# Patient Record
Sex: Female | Born: 1990 | Race: Black or African American | Hispanic: No | Marital: Single | Smoking: Never smoker
Health system: Southern US, Community
[De-identification: ages and names within clinical notes are randomized; demographics above are authoritative.]

## PROBLEM LIST (undated history)

## (undated) DIAGNOSIS — B009 Herpesviral infection, unspecified: Secondary | ICD-10-CM

## (undated) HISTORY — PX: NO PAST SURGERIES: SHX2092

---

## 2014-08-20 DIAGNOSIS — B009 Herpesviral infection, unspecified: Secondary | ICD-10-CM

## 2014-08-20 HISTORY — DX: Herpesviral infection, unspecified: B00.9

## 2017-05-01 ENCOUNTER — Inpatient Hospital Stay (HOSPITAL_COMMUNITY)
Admission: AD | Admit: 2017-05-01 | Discharge: 2017-05-01 | Disposition: A | Discharge status: Home / Self Care | Payer: Self-pay | Source: Ambulatory Visit | Attending: Obstetrics and Gynecology | Admitting: Obstetrics and Gynecology

## 2017-05-01 ENCOUNTER — Encounter (HOSPITAL_COMMUNITY): Payer: Self-pay

## 2017-05-01 DIAGNOSIS — D509 Iron deficiency anemia, unspecified: Secondary | ICD-10-CM

## 2017-05-01 DIAGNOSIS — Z8261 Family history of arthritis: Secondary | ICD-10-CM | POA: Insufficient documentation

## 2017-05-01 DIAGNOSIS — N939 Abnormal uterine and vaginal bleeding, unspecified: Secondary | ICD-10-CM

## 2017-05-01 DIAGNOSIS — Z3202 Encounter for pregnancy test, result negative: Secondary | ICD-10-CM

## 2017-05-01 DIAGNOSIS — Z8619 Personal history of other infectious and parasitic diseases: Secondary | ICD-10-CM | POA: Insufficient documentation

## 2017-05-01 DIAGNOSIS — Z825 Family history of asthma and other chronic lower respiratory diseases: Secondary | ICD-10-CM | POA: Insufficient documentation

## 2017-05-01 DIAGNOSIS — D696 Thrombocytopenia, unspecified: Secondary | ICD-10-CM

## 2017-05-01 HISTORY — DX: Herpesviral infection, unspecified: B00.9

## 2017-05-01 LAB — URINALYSIS, ROUTINE W REFLEX MICROSCOPIC
BACTERIA UA: NONE SEEN
Bilirubin Urine: NEGATIVE
Glucose, UA: NEGATIVE mg/dL
Ketones, ur: NEGATIVE mg/dL
Leukocytes, UA: NEGATIVE
NITRITE: NEGATIVE
PROTEIN: 100 mg/dL — AB
SPECIFIC GRAVITY, URINE: 1.025 (ref 1.005–1.030)
pH: 5 (ref 5.0–8.0)

## 2017-05-01 LAB — CBC
HCT: 27.8 % — ABNORMAL LOW (ref 36.0–46.0)
Hemoglobin: 8.8 g/dL — ABNORMAL LOW (ref 12.0–15.0)
MCH: 20.7 pg — AB (ref 26.0–34.0)
MCHC: 31.7 g/dL (ref 30.0–36.0)
MCV: 65.3 fL — AB (ref 78.0–100.0)
PLATELETS: 133 10*3/uL — AB (ref 150–400)
RBC: 4.26 MIL/uL (ref 3.87–5.11)
RDW: 17.5 % — AB (ref 11.5–15.5)
WBC: 4.7 10*3/uL (ref 4.0–10.5)

## 2017-05-01 LAB — WET PREP, GENITAL
CLUE CELLS WET PREP: NONE SEEN
Sperm: NONE SEEN
TRICH WET PREP: NONE SEEN
YEAST WET PREP: NONE SEEN

## 2017-05-01 LAB — POCT PREGNANCY, URINE: PREG TEST UR: NEGATIVE

## 2017-05-01 MED ORDER — NORGESTIMATE-ETH ESTRADIOL 0.25-35 MG-MCG PO TABS: 2.0000 | ORAL_TABLET | Freq: Every day | ORAL | 0 refills | Status: DC

## 2017-05-01 MED ORDER — FERROUS SULFATE 325 (65 FE) MG PO TABS: 325.0000 mg | ORAL_TABLET | Freq: Every day | ORAL | 2 refills | Status: DC

## 2017-05-01 MED ORDER — KETOROLAC TROMETHAMINE 60 MG/2ML IM SOLN
60.0000 mg | Freq: Once | INTRAMUSCULAR | Status: AC
  Administered 2017-05-01: 60 mg via INTRAMUSCULAR
  Filled 2017-05-01: qty 2

## 2017-05-01 NOTE — MAU Provider Note (Signed)
History     CSN: [id number]  Arrival date and time: 05/01/17 [id number]   First Provider Initiated Contact with Patient 05/01/17 2203      Chief Complaint  Patient presents with  . Nausea  . Emesis  . Abdominal Pain  . Vaginal Bleeding   Non-pregnant female here with LLQ pain, VB, and N/V. Nausea started 1 week ago and intermittent. Vomiting started today, 3 episodes. Tolerating some po. Pain started 2 days ago. Describes as sharp and intermittent. Has not taken anything for it. No urinary sx. Had BM today, no diarrhea. No sick contacts. No fevers. Started having VB today and LMP was 3 weeks ago. Changing pad and tampon every 2 hrs. Using condoms inconsistently. Recent STD screen negative. New partner 1 month ago. No vaginal discharge or odor prior to bleeding. Hx of HSV, no outbreak in "months". No sexual activity since before August menses.   Past Medical History:  Diagnosis Date  . HSV (herpes simplex virus) infection 2016   acyclovir twice daily    Past Surgical History:  Procedure Laterality Date  . NO PAST SURGERIES      Family History  Problem Relation Age of Onset  . Asthma Mother   . Arthritis Father   . Early death Father     Social History  Substance Use Topics  . Smoking status: Never Smoker  . Smokeless tobacco: Never Used  . Alcohol use 0.6 oz/week    1 Shots of liquor per week     Comment: 1-2 a month    Allergies: No Known Allergies  No prescriptions prior to admission.    Review of Systems  Constitutional: Negative for fever.  Gastrointestinal: Positive for abdominal pain, nausea and vomiting. Negative for constipation and diarrhea.  Genitourinary: Positive for vaginal bleeding. Negative for dysuria and vaginal discharge.   Physical Exam   Blood pressure 127/73, pulse 89, temperature 98.2 F (36.8 C), temperature source Oral, resp. rate 16, last menstrual period 04/11/2017, SpO2 100 %.  Physical Exam  Constitutional: She is oriented to person,  place, and time. She appears well-developed and well-nourished. No distress (appears comfortable).  HENT:  Head: Normocephalic and atraumatic.  Neck: Normal range of motion.  Cardiovascular: Normal rate.   Respiratory: Effort normal. No respiratory distress.  GI: Soft. She exhibits no distension and no mass. There is no tenderness. There is no rebound and no guarding.  Genitourinary:  Genitourinary Comments: External: no lesions or erythema Vagina: rugated, pink, moist, mod bloody discharge Uterus: non enlarged, anteverted, non tender, no CMT Adnexae: no masses, + tenderness left, no tenderness right   Musculoskeletal: Normal range of motion.  Neurological: She is alert and oriented to person, place, and time.  Skin: Skin is warm.  Psychiatric: She has a normal mood and affect.   Results for orders placed or performed during the hospital encounter of 05/01/17 (from the past 24 hour(s))  Urinalysis, Routine w reflex microscopic     Status: Abnormal   Collection Time: 05/01/17  8:45 PM  Result Value Ref Range   Color, Urine YELLOW YELLOW   APPearance HAZY (A) CLEAR   Specific Gravity, Urine 1.025 1.005 - 1.030   pH 5.0 5.0 - 8.0   Glucose, UA NEGATIVE NEGATIVE mg/dL   Hgb urine dipstick LARGE (A) NEGATIVE   Bilirubin Urine NEGATIVE NEGATIVE   Ketones, ur NEGATIVE NEGATIVE mg/dL   Protein, ur 102100 (A) NEGATIVE mg/dL   Nitrite NEGATIVE NEGATIVE   Leukocytes, UA NEGATIVE NEGATIVE  RBC / HPF TOO NUMEROUS TO COUNT 0 - 5 RBC/hpf   WBC, UA TOO NUMEROUS TO COUNT 0 - 5 WBC/hpf   Bacteria, UA NONE SEEN NONE SEEN   Squamous Epithelial / LPF 0-5 (A) NONE SEEN   Mucus PRESENT   Pregnancy, urine POC     Status: None   Collection Time: 05/01/17  9:05 PM  Result Value Ref Range   Preg Test, Ur NEGATIVE NEGATIVE  Wet prep, genital     Status: Abnormal   Collection Time: 05/01/17 10:20 PM  Result Value Ref Range   Yeast Wet Prep HPF POC NONE SEEN NONE SEEN   Trich, Wet Prep NONE SEEN NONE  SEEN   Clue Cells Wet Prep HPF POC NONE SEEN NONE SEEN   WBC, Wet Prep HPF POC MODERATE (A) NONE SEEN   Sperm NONE SEEN   CBC     Status: Abnormal   Collection Time: 05/01/17 10:39 PM  Result Value Ref Range   WBC 4.7 4.0 - 10.5 K/uL   RBC 4.26 3.87 - 5.11 MIL/uL   Hemoglobin 8.8 (L) 12.0 - 15.0 g/dL   HCT 16.1 (L) 09.6 - 04.5 %   MCV 65.3 (L) 78.0 - 100.0 fL   MCH 20.7 (L) 26.0 - 34.0 pg   MCHC 31.7 30.0 - 36.0 g/dL   RDW 40.9 (H) 81.1 - 91.4 %   Platelets 133 (L) 150 - 400 K/uL   MAU Course  Procedures Toradol  MDM Labs ordered and reviewed. No evidence of pregnancy. No evidence of acute abdominal or pelvic process. UA likely contaminated so will send UC. Will use OCPs for AUB. Stable for discharge home.   Assessment and Plan   1. Abnormal uterine bleeding (AUB)   2. Pregnancy examination or test, negative result   3. Iron deficiency anemia, unspecified iron deficiency anemia type   4. Thrombocytopenia (HCC)    Discharge home Follow up with GYN provider of choice to establish care Follow up with PCP provider of choice to establish care and follow thrombocytopenia Rx Sprintec Rx FeSO4 Ibuprofen OTC prn  Allergies as of 05/01/2017   No Known Allergies     Medication List    TAKE these medications   ferrous sulfate 325 (65 FE) MG tablet Take 1 tablet (325 mg total) by mouth daily with breakfast.   norgestimate-ethinyl estradiol 0.25-35 MG-MCG tablet Commonly known as:  ORTHO-CYCLEN,SPRINTEC,PREVIFEM Take 2 tablets by mouth daily. 2 tabs daily until bleeding stops then 1 tab daily            Discharge Care Instructions        Start     Ordered   05/01/17 0000  Discharge patient    Question Answer Comment  Discharge disposition 01-Home or Self Care   Discharge patient date 05/01/2017      05/01/17 2334   05/01/17 0000  norgestimate-ethinyl estradiol (ORTHO-CYCLEN,SPRINTEC,PREVIFEM) 0.25-35 MG-MCG tablet  Daily    Question:  Supervising Provider  Answer:   Tilda Burrow   05/01/17 2334   05/01/17 0000  ferrous sulfate 325 (65 FE) MG tablet  Daily with breakfast    Question:  Supervising Provider  Answer:  Tilda Burrow   05/01/17 2334     Donette Larry, CNM 05/01/2017, 10:19 PM

## 2017-05-01 NOTE — Discharge Instructions (Signed)
Dysfunctional Uterine Bleeding °Dysfunctional uterine bleeding is abnormal bleeding from the uterus. Dysfunctional uterine bleeding includes: °· A period that comes earlier or later than usual. °· A period that is lighter, heavier, or has blood clots. °· Bleeding between periods. °· Skipping one or more periods. °· Bleeding after sexual intercourse. °· Bleeding after menopause. ° °Follow these instructions at home: °Pay attention to any changes in your symptoms. Follow these instructions to help with your condition: °Eating and drinking °· Eat well-balanced meals. Include foods that are high in iron, such as liver, meat, shellfish, green leafy vegetables, and eggs. °· If you become constipated: °? Drink plenty of water. °? Eat fruits and vegetables that are high in water and fiber, such as spinach, carrots, raspberries, apples, and mango. °Medicines °· Take over-the-counter and prescription medicines only as told by your health care provider. °· Do not change medicines without talking with your health care provider. °· Aspirin or medicines that contain aspirin may make the bleeding worse. Do not take those medicines: °? During the week before your period. °? During your period. °· If you were prescribed iron pills, take them as told by your health care provider. Iron pills help to replace iron that your body loses because of this condition. °Activity °· If you need to change your sanitary pad or tampon more than one time every 2 hours: °? Lie in bed with your feet raised (elevated). °? Place a cold pack on your lower abdomen. °? Rest as much as possible until the bleeding stops or slows down. °· Do not try to lose weight until the bleeding has stopped and your blood iron level is back to normal. °Other Instructions °· For two months, write down: °? When your period starts. °? When your period ends. °? When any abnormal bleeding occurs. °? What problems you notice. °· Keep all follow up visits as told by your health  care provider. This is important. °Contact a health care provider if: °· You get light-headed or weak. °· You have nausea and vomiting. °· You cannot eat or drink without vomiting. °· You feel dizzy or have diarrhea while you are taking medicines. °· You are taking birth control pills or hormones, and you want to change them or stop taking them. °Get help right away if: °· You develop a fever or chills. °· You need to change your sanitary pad or tampon more than one time per hour. °· Your bleeding becomes heavier, or your flow contains clots more often. °· You develop pain in your abdomen. °· You lose consciousness. °· You develop a rash. °This information is not intended to replace advice given to you by your health care provider. Make sure you discuss any questions you have with your health care provider. °Document Released: 08/03/2000 Document Revised: 01/12/2016 Document Reviewed: 11/01/2014 °Elsevier Interactive Patient Education © 2018 [employer] ° °

## 2017-05-01 NOTE — Progress Notes (Addendum)
Non-pregnant pt, presents to triage for left side pain and bleeding with n/v  that started    UPT negative.   2212: Provider at bs assessing pt.   Lab ordered. 2232: Medicated for pain  Discharge instructions given with pt understanding. Pt left unit via ambulatory

## 2017-05-01 NOTE — MAU Note (Signed)
Pt thought she had a period a week and a half ago but started bleeding today, she changes her pad an hour.  She has pain on her left side that is sharp and it comes and goes.  Has not taken anything OTC for her pain.  Denies vaginal discharge.  Has had N/V but no diarrhea.  Has had 3 episodes of vomiting in the last 24 hours.

## 2017-05-02 LAB — GC/CHLAMYDIA PROBE AMP (~~LOC~~) NOT AT ARMC
Chlamydia: NEGATIVE
NEISSERIA GONORRHEA: NEGATIVE

## 2017-05-03 LAB — URINE CULTURE

## 2017-05-04 ENCOUNTER — Other Ambulatory Visit: Payer: Self-pay | Admitting: Obstetrics and Gynecology

## 2017-05-04 MED ORDER — AMOXICILLIN-POT CLAVULANATE 875-125 MG PO TABS: 1.0000 | ORAL_TABLET | Freq: Two times a day (BID) | ORAL | 0 refills | Status: AC

## 2017-05-04 NOTE — Progress Notes (Signed)
TC to pt notifying her of Rx to be sent -- requests Rx to be sent to pharmacy near [employer] -- notified will send to [facility] on [address].  Rx called in d/t to no e-scribe capabilities.  Raelyn Mora, CNM 05/04/2017 9:55 AM

## 2017-06-08 ENCOUNTER — Emergency Department (HOSPITAL_COMMUNITY)
Admission: EM | Admit: 2017-06-08 | Discharge: 2017-06-08 | Disposition: A | Discharge status: Home / Self Care | Payer: Self-pay | Attending: Emergency Medicine | Admitting: Emergency Medicine

## 2017-06-08 ENCOUNTER — Encounter (HOSPITAL_COMMUNITY): Payer: Self-pay | Admitting: Emergency Medicine

## 2017-06-08 DIAGNOSIS — L0231 Cutaneous abscess of buttock: Secondary | ICD-10-CM | POA: Insufficient documentation

## 2017-06-08 DIAGNOSIS — L02211 Cutaneous abscess of abdominal wall: Secondary | ICD-10-CM | POA: Insufficient documentation

## 2017-06-08 MED ORDER — SULFAMETHOXAZOLE-TRIMETHOPRIM 800-160 MG PO TABS: 1.0000 | ORAL_TABLET | Freq: Two times a day (BID) | ORAL | 0 refills | Status: AC

## 2017-06-08 MED ORDER — LIDOCAINE HCL (PF) 1 % IJ SOLN
5.0000 mL | Freq: Once | INTRAMUSCULAR | Status: AC
  Administered 2017-06-08: 5 mL
  Filled 2017-06-08: qty 5

## 2017-06-08 MED ORDER — CEPHALEXIN 500 MG PO CAPS: 500.0000 mg | ORAL_CAPSULE | Freq: Four times a day (QID) | ORAL | 0 refills | Status: DC

## 2017-06-08 NOTE — ED Triage Notes (Signed)
Patient reports she had an abscess on her buttocks region drained two weeks ago but now has noticed two more abscesses. One to the left buttocks and one to the mid lower abdomen.

## 2017-06-08 NOTE — ED Provider Notes (Signed)
[facility]-EMERGENCY DEPT Provider Note   CSN: [id number] Arrival date & time: 06/08/17  1258     History   Chief Complaint Chief Complaint  Patient presents with  . Abscess    HPI Jennifer Logan is a 26 y.o. female who presents to the ED with an abscess to the left buttock and the lower abdomen. The areas started about a week ago and have gotten larger. The area on the buttock has started to drain. Patient reports that 2 weeks ago she had an abscess to the right buttock that drained on its own.   The history is provided by the patient. No language interpreter was used.  Abscess  Location:  Torso and pelvis Torso abscess location:  Abd LLQ Pelvic abscess location:  L buttock Abscess quality: fluctuance   Red streaking: no   Duration:  1 week Progression:  Worsening Chronicity:  Recurrent Context: not diabetes   Relieved by:  Nothing Worsened by:  Nothing Ineffective treatments:  Warm compresses Associated symptoms: nausea   Associated symptoms: no fever, no headaches and no vomiting   Risk factors: prior abscess     Past Medical History:  Diagnosis Date  . HSV (herpes simplex virus) infection 2016   acyclovir twice daily    There are no active problems to display for this patient.   Past Surgical History:  Procedure Laterality Date  . NO PAST SURGERIES      OB History    No data available       Home Medications    Prior to Admission medications   Medication Sig Start Date End Date Taking? Authorizing Provider  cephALEXin (KEFLEX) 500 MG capsule Take 1 capsule (500 mg total) by mouth 4 (four) times daily. 06/08/17   Janne Napoleon, NP  ferrous sulfate 325 (65 FE) MG tablet Take 1 tablet (325 mg total) by mouth daily with breakfast. 05/01/17   Donette Larry, CNM  norgestimate-ethinyl estradiol (ORTHO-CYCLEN,SPRINTEC,PREVIFEM) 0.25-35 MG-MCG tablet Take 2 tablets by mouth daily. 2 tabs daily until bleeding stops then 1 tab daily  05/01/17   Donette Larry, CNM  sulfamethoxazole-trimethoprim (BACTRIM DS,SEPTRA DS) 800-160 MG tablet Take 1 tablet by mouth 2 (two) times daily. 06/08/17 06/15/17  Janne Napoleon, NP    Family History Family History  Problem Relation Age of Onset  . Asthma Mother   . Arthritis Father   . Early death Father     Social History Social History  Substance Use Topics  . Smoking status: Never Smoker  . Smokeless tobacco: Never Used  . Alcohol use 0.6 oz/week    1 Shots of liquor per week     Comment: 1-2 a month     Allergies   Patient has no known allergies.   Review of Systems Review of Systems  Constitutional: Positive for chills. Negative for fever.  HENT: Negative.   Respiratory: Negative for cough.   Cardiovascular: Negative for chest pain.  Gastrointestinal: Positive for nausea. Negative for vomiting. Abdominal pain: at area of abscess.  Genitourinary: Positive for vaginal discharge. Negative for dysuria, frequency and urgency.  Musculoskeletal: Negative for neck stiffness.  Skin: Positive for wound.  Neurological: Negative for dizziness, syncope and headaches.  Hematological: Negative for adenopathy.  Psychiatric/Behavioral: Negative for confusion.     Physical Exam Updated Vital Signs BP 126/68 (BP Location: Right Arm)   Pulse (!) 58   Temp 98.2 F (36.8 C) (Oral)   Resp 17   LMP 05/29/2017   SpO2  98%   Physical Exam  Constitutional: She is oriented to person, place, and time. She appears well-developed and well-nourished. No distress.  HENT:  Head: Normocephalic.  Eyes: EOM are normal.  Neck: Neck supple.  Cardiovascular: Normal rate.   Pulmonary/Chest: Effort normal.  Abdominal:  3 cm raised, tender, fluctuant area with erythema and pustular center to the lower abdomen.  Musculoskeletal: Normal range of motion.  Neurological: She is alert and oriented to person, place, and time.  Skin: Skin is warm and dry.  There is a raised tender area to the  left buttock that is draining purulent drainage.   Psychiatric: She has a normal mood and affect. Her behavior is normal.  Nursing note and vitals reviewed.    ED Treatments / Results  Labs (all labs ordered are listed, but only abnormal results are displayed) Labs Reviewed - No data to display Radiology No results found.  Procedures .Marland Kitchen.Incision and Drainage Date/Time: 06/08/2017 4:38 PM Performed by: Janne NapoleonNEESE, Javyn Havlin M Authorized by: Janne NapoleonNEESE, Darnelle [employer] M   Consent:    Consent obtained:  Verbal   Consent given by:  Patient   Risks discussed:  Incomplete drainage and pain   Alternatives discussed:  Alternative treatment Location:    Type:  Abscess   Size:  3 cm   Location:  Trunk   Trunk location:  Abdomen Pre-procedure details:    Skin preparation:  Betadine Anesthesia (see MAR for exact dosages):    Anesthesia method:  Local infiltration   Local anesthetic:  Lidocaine 1% w/o epi Procedure type:    Complexity:  Complex Procedure details:    Needle aspiration: no     Incision types:  Single straight   Incision depth:  Dermal   Scalpel blade:  11   Wound management:  Probed and deloculated and irrigated with saline   Drainage:  Purulent and bloody   Drainage amount:  Moderate   Packing materials:  1/4 in iodoform gauze Post-procedure details:    Patient tolerance of procedure:  Tolerated well, no immediate complications   (including critical care time)  Medications Ordered in ED Medications  lidocaine (PF) (XYLOCAINE) 1 % injection 5 mL (5 mLs Infiltration Given 06/08/17 1506)     Initial Impression / Assessment and Plan / ED Course  I have reviewed the triage vital signs and the nursing notes. 26 y.o. female with abscess to the left buttock that is draining so I&D not indicated. Abscess to the lower abdomen did require I&D. Patient stable for d/c without fever and does not appear toxic. Will treat with antibiotics and patient will f/u in 2 days for wound check and packing  removal.   Final Clinical Impressions(s) / ED Diagnoses   Final diagnoses:  Abscess of abdominal wall  Abscess of left buttock    New Prescriptions Discharge Medication List as of 06/08/2017  4:36 PM    START taking these medications   Details  cephALEXin (KEFLEX) 500 MG capsule Take 1 capsule (500 mg total) by mouth 4 (four) times daily., Starting Sat 06/08/2017, Print    sulfamethoxazole-trimethoprim (BACTRIM DS,SEPTRA DS) 800-160 MG tablet Take 1 tablet by mouth 2 (two) times daily., Starting Sat 06/08/2017, Until Sat 06/15/2017, Print         SteinauerNeese, McGregorHope M, NP 06/08/17 2321    Charlynne PanderYao, David Hsienta, MD 06/09/17 [id number]

## 2017-06-08 NOTE — Discharge Instructions (Signed)
Take the antibiotics as directed. Follow up in 2 days with your doctor, Urgent Care or return here for wound check and packing removal.

## 2017-06-08 NOTE — ED Notes (Signed)
Pt ambulatory and independent at discharge.  Verbalized understanding of discharge instructions 

## 2017-06-10 ENCOUNTER — Emergency Department (HOSPITAL_COMMUNITY)
Admission: EM | Admit: 2017-06-10 | Discharge: 2017-06-10 | Disposition: A | Discharge status: Home / Self Care | Payer: Self-pay | Attending: Emergency Medicine | Admitting: Emergency Medicine

## 2017-06-10 ENCOUNTER — Encounter (HOSPITAL_COMMUNITY): Payer: Self-pay

## 2017-06-10 DIAGNOSIS — Z4801 Encounter for change or removal of surgical wound dressing: Secondary | ICD-10-CM | POA: Insufficient documentation

## 2017-06-10 DIAGNOSIS — Z5189 Encounter for other specified aftercare: Secondary | ICD-10-CM

## 2017-06-10 DIAGNOSIS — Z79899 Other long term (current) drug therapy: Secondary | ICD-10-CM | POA: Insufficient documentation

## 2017-06-10 NOTE — ED Provider Notes (Signed)
[facility]-EMERGENCY DEPT Provider Note   CSN: [id number] Arrival date & time: 06/10/17  7829     History   Chief Complaint Chief Complaint  Patient presents with  . Wound Check    HPI Jennifer Logan is a 26 y.o. female with recent abscess to lower abdomen presents to ED for evaluation of wound after incision and drainage performed 2 days ago in ED. Patient was discharged with antibiotics, which she has been taking. Has also been doing warm compresses. She denies fevers, chills, worsening pain, worsening swelling, redness or drainage. Reports mild tenderness with palpation. HPI  Past Medical History:  Diagnosis Date  . HSV (herpes simplex virus) infection 2016   acyclovir twice daily    There are no active problems to display for this patient.   Past Surgical History:  Procedure Laterality Date  . NO PAST SURGERIES      OB History    No data available       Home Medications    Prior to Admission medications   Medication Sig Start Date End Date Taking? Authorizing Provider  cephALEXin (KEFLEX) 500 MG capsule Take 1 capsule (500 mg total) by mouth 4 (four) times daily. 06/08/17   Janne Napoleon, NP  ferrous sulfate 325 (65 FE) MG tablet Take 1 tablet (325 mg total) by mouth daily with breakfast. 05/01/17   Donette Larry, CNM  norgestimate-ethinyl estradiol (ORTHO-CYCLEN,SPRINTEC,PREVIFEM) 0.25-35 MG-MCG tablet Take 2 tablets by mouth daily. 2 tabs daily until bleeding stops then 1 tab daily 05/01/17   Donette Larry, CNM  sulfamethoxazole-trimethoprim (BACTRIM DS,SEPTRA DS) 800-160 MG tablet Take 1 tablet by mouth 2 (two) times daily. 06/08/17 06/15/17  Janne Napoleon, NP    Family History Family History  Problem Relation Age of Onset  . Asthma Mother   . Arthritis Father   . Early death Father     Social History Social History  Substance Use Topics  . Smoking status: Never Smoker  . Smokeless tobacco: Never Used  . Alcohol use  0.6 oz/week    1 Shots of liquor per week     Comment: 1-2 a month     Allergies   Patient has no known allergies.   Review of Systems Review of Systems  Constitutional: Negative for chills and fever.  Skin: Positive for color change.       Abscess/wound     Physical Exam Updated Vital Signs BP 107/76 (BP Location: Right Arm)   Pulse 86   Temp 98.4 F (36.9 C) (Oral)   Resp 16   Ht 5\' 7"  (1.702 m)   Wt 86.2 kg (190 lb)   LMP 05/29/2017   SpO2 99%   BMI 29.76 kg/m   Physical Exam  Constitutional: She is oriented to person, place, and time. She appears well-developed and well-nourished. No distress.  NAD.  HENT:  Head: Normocephalic and atraumatic.  Right Ear: External ear normal.  Left Ear: External ear normal.  Nose: Nose normal.  Eyes: Conjunctivae and EOM are normal. No scleral icterus.  Neck: Normal range of motion. Neck supple.  Cardiovascular: Normal rate, regular rhythm and normal heart sounds.   No murmur heard. Pulmonary/Chest: Effort normal and breath sounds normal. She has no wheezes.  Musculoskeletal: Normal range of motion. She exhibits no deformity.  Neurological: She is alert and oriented to person, place, and time.  Skin: Skin is warm and dry. Capillary refill takes less than 2 seconds.  1 cm incision below umbilicus with  packing in place, surrounding skin is skin colored w/o erythema, edema, warmth. Appropriately tender.   Psychiatric: She has a normal mood and affect. Her behavior is normal. Judgment and thought content normal.  Nursing note and vitals reviewed.    ED Treatments / Results  Labs (all labs ordered are listed, but only abnormal results are displayed) Labs Reviewed - No data to display  EKG  EKG Interpretation None       Radiology No results found.  Procedures Procedures (including critical care time)  Removal of wound packing performed by myself. Scant amount of purulent drainage expressed from wound with mild  tenderness. Patient tolerated procedure well without complications. Gauze and paper tape used to cover wound.   Medications Ordered in ED Medications - No data to display   Initial Impression / Assessment and Plan / ED Course  I have reviewed the triage vital signs and the nursing notes.  Pertinent labs & imaging results that were available during my care of the patient were reviewed by me and considered in my medical decision making (see chart for details).    Patient here for wound check after abscess to lower abdomen was incised and drained 2 days ago in ED. Packing was removed. No evidence of free infection or recent collection. Wound appears to be healing well. No extensive surrounding cellulitis or induration. She denies fevers, chills or worsening pain. We'll discharge at this time, she is to continue taking antibiotics. NSAID for inflammation and pain. Discussed specific return instructions.  Final Clinical Impressions(s) / ED Diagnoses   Final diagnoses:  Visit for wound check    New Prescriptions New Prescriptions   No medications on file     Liberty HandyGibbons, Alianah Lofton J, PA-C 06/10/17 1150    Samuel JesterMcManus, Kathleen, DO 06/14/17 [id number]

## 2017-06-10 NOTE — ED Notes (Signed)
Bed: WTR8 Expected date:  Expected time:  Means of arrival:  Comments: 

## 2017-06-10 NOTE — Discharge Instructions (Signed)
Continue warm compresses or warm heating pad.   Clean wound as instructed today at least once daily or whenever dirty.   Take ibuprofen or tylenol for pain.   Finish taking your antibiotics.   Return for fevers, chills, worsening pain, swelling or signs of re-infection.

## 2017-06-10 NOTE — ED Triage Notes (Addendum)
Patient states she had an abdominal abscess drained 2 days ago and was told to come back for a recheck.

## 2017-11-15 ENCOUNTER — Emergency Department (HOSPITAL_COMMUNITY)
Admission: EM | Admit: 2017-11-15 | Discharge: 2017-11-15 | Disposition: A | Discharge status: Home / Self Care | Payer: Self-pay | Attending: Emergency Medicine | Admitting: Emergency Medicine

## 2017-11-15 ENCOUNTER — Encounter (HOSPITAL_COMMUNITY): Payer: Self-pay | Admitting: *Deleted

## 2017-11-15 DIAGNOSIS — R102 Pelvic and perineal pain: Secondary | ICD-10-CM | POA: Insufficient documentation

## 2017-11-15 DIAGNOSIS — R112 Nausea with vomiting, unspecified: Secondary | ICD-10-CM | POA: Insufficient documentation

## 2017-11-15 DIAGNOSIS — R197 Diarrhea, unspecified: Secondary | ICD-10-CM | POA: Insufficient documentation

## 2017-11-15 DIAGNOSIS — Z79899 Other long term (current) drug therapy: Secondary | ICD-10-CM | POA: Insufficient documentation

## 2017-11-15 LAB — CBC
HEMATOCRIT: 31.6 % — AB (ref 36.0–46.0)
HEMOGLOBIN: 9.7 g/dL — AB (ref 12.0–15.0)
MCH: 20.8 pg — AB (ref 26.0–34.0)
MCHC: 30.7 g/dL (ref 30.0–36.0)
MCV: 67.8 fL — AB (ref 78.0–100.0)
Platelets: 143 10*3/uL — ABNORMAL LOW (ref 150–400)
RBC: 4.66 MIL/uL (ref 3.87–5.11)
RDW: 17.3 % — ABNORMAL HIGH (ref 11.5–15.5)
WBC: 5.6 10*3/uL (ref 4.0–10.5)

## 2017-11-15 LAB — COMPREHENSIVE METABOLIC PANEL
ALBUMIN: 4.3 g/dL (ref 3.5–5.0)
ALT: 14 U/L (ref 14–54)
ANION GAP: 9 (ref 5–15)
AST: 23 U/L (ref 15–41)
Alkaline Phosphatase: 40 U/L (ref 38–126)
BUN: 10 mg/dL (ref 6–20)
CALCIUM: 8.9 mg/dL (ref 8.9–10.3)
CO2: 25 mmol/L (ref 22–32)
Chloride: 107 mmol/L (ref 101–111)
Creatinine, Ser: 0.73 mg/dL (ref 0.44–1.00)
GFR calc non Af Amer: >60 mL/min (ref >60)
GLUCOSE: 102 mg/dL — AB (ref 65–99)
Potassium: 4 mmol/L (ref 3.5–5.1)
SODIUM: 141 mmol/L (ref 135–145)
Total Bilirubin: 0.5 mg/dL (ref 0.3–1.2)
Total Protein: 7.7 g/dL (ref 6.5–8.1)

## 2017-11-15 LAB — I-STAT BETA HCG BLOOD, ED (MC, WL, AP ONLY)

## 2017-11-15 LAB — LIPASE, BLOOD: LIPASE: 27 U/L (ref 11–51)

## 2017-11-15 MED ORDER — ONDANSETRON HCL 4 MG PO TABS: 4.0000 mg | ORAL_TABLET | Freq: Four times a day (QID) | ORAL | 0 refills | Status: DC

## 2017-11-15 MED ORDER — ONDANSETRON 4 MG PO TBDP
4.0000 mg | ORAL_TABLET | Freq: Once | ORAL | Status: AC | PRN
  Administered 2017-11-15: 4 mg via ORAL
  Filled 2017-11-15: qty 1

## 2017-11-15 MED ORDER — SODIUM CHLORIDE 0.9 % IV BOLUS: 1000.0000 mL | Freq: Once | INTRAVENOUS | Status: DC

## 2017-11-15 NOTE — ED Provider Notes (Signed)
[facility] Provider Note   CSN: [id number] Arrival date & time: 11/15/17  1020     History   Chief Complaint Chief Complaint  Patient presents with  . Emesis    HPI Jennifer Logan is a 27 y.o. female.  The history is provided by the patient.  Emesis   This is a new problem. The current episode started 3 to 5 hours ago. The problem occurs 2 to 4 times per day. The problem has been gradually improving. The emesis has an appearance of stomach contents. There has been no fever. Associated symptoms include abdominal pain, chills, diarrhea and sweats. Pertinent negatives include no cough, no myalgias and no URI. Risk factors: Unknown if she has had any sick contacts but she does work at [employer].  Denies recent travel, antibiotics or bad food.    Past Medical History:  Diagnosis Date  . HSV (herpes simplex virus) infection 2016   acyclovir twice daily    There are no active problems to display for this patient.   Past Surgical History:  Procedure Laterality Date  . NO PAST SURGERIES       OB History   None      Home Medications    Prior to Admission medications   Medication Sig Start Date End Date Taking? Authorizing Provider  valACYclovir (VALTREX) 500 MG tablet Take 500 mg by mouth daily.   Yes [provider]  cephALEXin (KEFLEX) 500 MG capsule Take 1 capsule (500 mg total) by mouth 4 (four) times daily. Patient not taking: Reported on 11/15/2017 06/08/17   Janne NapoleonNeese, Hope M, NP  ferrous sulfate 325 (65 FE) MG tablet Take 1 tablet (325 mg total) by mouth daily with breakfast. Patient not taking: Reported on 11/15/2017 05/01/17   Donette LarryBhambri, Melanie, CNM  norgestimate-ethinyl estradiol (ORTHO-CYCLEN,SPRINTEC,PREVIFEM) 0.25-35 MG-MCG tablet Take 2 tablets by mouth daily. 2 tabs daily until bleeding stops then 1 tab daily Patient not taking: Reported on 11/15/2017 05/01/17   Donette LarryBhambri, Melanie, CNM    Family History Family History    Problem Relation Age of Onset  . Asthma Mother   . Arthritis Father   . Early death Father     Social History Social History   Tobacco Use  . Smoking status: Never Smoker  . Smokeless tobacco: Never Used  Substance Use Topics  . Alcohol use: Yes    Alcohol/week: 0.6 oz    Types: 1 Shots of liquor per week    Comment: 1-2 a month  . Drug use: Yes    Types: Marijuana     Allergies   Patient has no known allergies.   Review of Systems Review of Systems  Constitutional: Positive for chills.  Respiratory: Negative for cough.   Gastrointestinal: Positive for abdominal pain, diarrhea and vomiting.  Musculoskeletal: Negative for myalgias.  All other systems reviewed and are negative.    Physical Exam Updated Vital Signs BP 112/70   Pulse (!) 58   Temp 97.9 F (36.6 C) (Oral)   Resp 17   LMP 10/18/2017   SpO2 100%   Physical Exam  Constitutional: She is oriented to person, place, and time. She appears well-developed and well-nourished. No distress.  HENT:  Head: Normocephalic and atraumatic.  Mouth/Throat: Oropharynx is clear and moist.  Eyes: Pupils are equal, round, and reactive to light. Conjunctivae and EOM are normal.  Neck: Normal range of motion. Neck supple.  Cardiovascular: Normal rate, regular rhythm and intact distal pulses.  No murmur heard.  Pulmonary/Chest: Effort normal and breath sounds normal. No respiratory distress. She has no wheezes. She has no rales.  Abdominal: Soft. She exhibits no distension. There is no tenderness. There is no rebound and no guarding.  Musculoskeletal: Normal range of motion. She exhibits no edema or tenderness.  Neurological: She is alert and oriented to person, place, and time.  Skin: Skin is warm and dry. No rash noted. No erythema.  Psychiatric: She has a normal mood and affect. Her behavior is normal.  Nursing note and vitals reviewed.    ED Treatments / Results  Labs (all labs ordered are listed, but only  abnormal results are displayed) Labs Reviewed  COMPREHENSIVE METABOLIC PANEL - Abnormal; Notable for the following components:      Result Value   Glucose, Bld 102 (*)    All other components within normal limits  CBC - Abnormal; Notable for the following components:   Hemoglobin 9.7 (*)    HCT 31.6 (*)    MCV 67.8 (*)    MCH 20.8 (*)    RDW 17.3 (*)    Platelets 143 (*)    All other components within normal limits  LIPASE, BLOOD  URINALYSIS, ROUTINE W REFLEX MICROSCOPIC  I-STAT BETA HCG BLOOD, ED ([facility], [facility], [facility] ONLY)    EKG None  Radiology No results found.  Procedures Procedures (including critical care time)  Medications Ordered in ED Medications  sodium chloride 0.9 % bolus 1,000 mL (0 mLs Intravenous Hold 11/15/17 1239)  ondansetron (ZOFRAN-ODT) disintegrating tablet 4 mg (4 mg Oral Given 11/15/17 1043)     Initial Impression / Assessment and Plan / ED Course  I have reviewed the triage vital signs and the nursing notes.  Pertinent labs & imaging results that were available during my care of the patient were reviewed by me and considered in my medical decision making (see chart for details).    Pt with symptoms most consistent with a viral process with vomitting/diarrhea.  Denies bad food exposure and recent travel out of the country.  No recent abx.  No hx concerning for GU pathology or kidney stones.  Pt is awake and alert on exam without peritoneal signs. Patient has no abdominal pain on my exam.  She had oral Zofran and has been able to tolerate p.o. since that time.  Labs ordered while she was in triage without acute findings.  Will discharge the patient home.   Final Clinical Impressions(s) / ED Diagnoses   Final diagnoses:  Nausea vomiting and diarrhea    ED Discharge Orders        Ordered    ondansetron (ZOFRAN) 4 MG tablet  Every 6 hours     11/15/17 1327       Gwyneth Sprout, MD 11/15/17 1328

## 2017-11-15 NOTE — ED Triage Notes (Signed)
Pt complains of vomiting, diarrhea and shaking since this morning while getting ready to go to work.

## 2017-11-15 NOTE — ED Notes (Signed)
Pt aware UA needed. 

## 2018-01-29 ENCOUNTER — Encounter (HOSPITAL_COMMUNITY): Payer: Self-pay | Admitting: *Deleted

## 2018-01-29 ENCOUNTER — Inpatient Hospital Stay (HOSPITAL_COMMUNITY)
Admission: AD | Admit: 2018-01-29 | Discharge: 2018-01-29 | Disposition: A | Discharge status: Home / Self Care | Payer: Self-pay | Source: Ambulatory Visit | Attending: Obstetrics and Gynecology | Admitting: Obstetrics and Gynecology

## 2018-01-29 ENCOUNTER — Other Ambulatory Visit: Payer: Self-pay

## 2018-01-29 DIAGNOSIS — B9689 Other specified bacterial agents as the cause of diseases classified elsewhere: Secondary | ICD-10-CM

## 2018-01-29 DIAGNOSIS — N926 Irregular menstruation, unspecified: Secondary | ICD-10-CM

## 2018-01-29 DIAGNOSIS — R109 Unspecified abdominal pain: Secondary | ICD-10-CM | POA: Insufficient documentation

## 2018-01-29 DIAGNOSIS — Z3202 Encounter for pregnancy test, result negative: Secondary | ICD-10-CM | POA: Insufficient documentation

## 2018-01-29 DIAGNOSIS — B373 Candidiasis of vulva and vagina: Secondary | ICD-10-CM | POA: Insufficient documentation

## 2018-01-29 DIAGNOSIS — B3731 Acute candidiasis of vulva and vagina: Secondary | ICD-10-CM

## 2018-01-29 DIAGNOSIS — N760 Acute vaginitis: Secondary | ICD-10-CM

## 2018-01-29 LAB — URINALYSIS, ROUTINE W REFLEX MICROSCOPIC
BACTERIA UA: NONE SEEN
Bilirubin Urine: NEGATIVE
Glucose, UA: NEGATIVE mg/dL
Hgb urine dipstick: NEGATIVE
KETONES UR: NEGATIVE mg/dL
Nitrite: NEGATIVE
Protein, ur: NEGATIVE mg/dL
Specific Gravity, Urine: 1.02 (ref 1.005–1.030)
pH: 5 (ref 5.0–8.0)

## 2018-01-29 LAB — WET PREP, GENITAL
Sperm: NONE SEEN
Trich, Wet Prep: NONE SEEN

## 2018-01-29 LAB — POCT PREGNANCY, URINE: PREG TEST UR: NEGATIVE

## 2018-01-29 MED ORDER — METRONIDAZOLE 500 MG PO TABS: 500.0000 mg | ORAL_TABLET | Freq: Two times a day (BID) | ORAL | 0 refills | Status: DC

## 2018-01-29 MED ORDER — KETOROLAC TROMETHAMINE 60 MG/2ML IM SOLN
60.0000 mg | Freq: Once | INTRAMUSCULAR | Status: AC
  Administered 2018-01-29: 60 mg via INTRAMUSCULAR
  Filled 2018-01-29: qty 2

## 2018-01-29 MED ORDER — FLUCONAZOLE 150 MG PO TABS
150.0000 mg | ORAL_TABLET | Freq: Every day | ORAL | 1 refills | Status: DC
Start: 2018-01-29 — End: 2021-03-09

## 2018-01-29 NOTE — MAU Note (Signed)
LMP was 5/7.  Neg HPT last Friday.   Pain in LLQ for about a wk, sharp cramping.  Today when had bm, noted blood from rectum when she wiped.

## 2018-01-29 NOTE — MAU Provider Note (Signed)
History     CSN: [id number]  Arrival date and time: 01/29/18 1654   First Provider Initiated Contact with Patient 01/29/18 1826      Chief Complaint  Patient presents with  . Abdominal Pain  . Possible Pregnancy  . Rectal Bleeding   HPI  Jennifer Logan is a 27 y.o. female who presents with abdominal pain, vaginal bleeding, and possible pregnancy. LMP was 5/7. Reports regular monthly periods. Has never been on birth control. In monogamous relationship with same partner x 7 months; does not use condoms. Was expecting her period last week but it didn't come; took HPT on Friday that was negative.  Reports vaginal bleeding noted on toilet paper this morning. Is not bleeding into a pad. Reports LLQ abdominal cramping x 1 week. Pain is intermittent. Rates pain 8/10. Took tylenol without relief. Nothing makes pain better or worse. Last BM was this morning, soft stool; no issues with diarrhea or constipation. Not bright red spotting on toilet paper after her BM. No hematochezia.  Endorses vaginal discharge. Thin white discharge. No odor or irritation.  Denies fever/chills, n/v/d, constipation, dysuria.  Does not have a PCP d/t no insurance.   Past Medical History:  Diagnosis Date  . HSV (herpes simplex virus) infection 2016   acyclovir twice daily    Past Surgical History:  Procedure Laterality Date  . NO PAST SURGERIES      Family History  Problem Relation Age of Onset  . Asthma Mother   . Arthritis Father   . Early death Father     Social History   Tobacco Use  . Smoking status: Never Smoker  . Smokeless tobacco: Never Used  Substance Use Topics  . Alcohol use: Yes    Alcohol/week: 0.6 oz    Types: 1 Shots of liquor per week    Comment: 1-2 a month  . Drug use: Yes    Types: Marijuana    Allergies: No Known Allergies  Medications Prior to Admission  Medication Sig Dispense Refill Last Dose  . cephALEXin (KEFLEX) 500 MG capsule Take 1 capsule (500 mg total) by  mouth 4 (four) times daily. (Patient not taking: Reported on 11/15/2017) 20 capsule 0 Not Taking at Unknown time  . ferrous sulfate 325 (65 FE) MG tablet Take 1 tablet (325 mg total) by mouth daily with breakfast. (Patient not taking: Reported on 11/15/2017) 30 tablet 2 Not Taking at Unknown time  . norgestimate-ethinyl estradiol (ORTHO-CYCLEN,SPRINTEC,PREVIFEM) 0.25-35 MG-MCG tablet Take 2 tablets by mouth daily. 2 tabs daily until bleeding stops then 1 tab daily (Patient not taking: Reported on 11/15/2017) 2 Package 0 Not Taking at Unknown time  . ondansetron (ZOFRAN) 4 MG tablet Take 1 tablet (4 mg total) by mouth every 6 (six) hours. 12 tablet 0   . valACYclovir (VALTREX) 500 MG tablet Take 500 mg by mouth daily.   11/14/2017 at Unknown time    Review of Systems  Constitutional: Negative.   Gastrointestinal: Positive for abdominal pain. Negative for abdominal distention, blood in stool, diarrhea, nausea, rectal pain and vomiting.  Genitourinary: Positive for menstrual problem, vaginal bleeding and vaginal discharge. Negative for dyspareunia and dysuria.   Physical Exam   Blood pressure 112/71, pulse 60, temperature 99.7 F (37.6 C), temperature source Oral, resp. rate 16, weight 192 lb 4 oz (87.2 kg), last menstrual period 12/24/2017, SpO2 100 %.  Physical Exam  Nursing note and vitals reviewed. Constitutional: She is oriented to person, place, and time. She appears well-developed and well-nourished.  No distress.  HENT:  Head: Normocephalic and atraumatic.  Eyes: Conjunctivae are normal. Right eye exhibits no discharge. Left eye exhibits no discharge. No scleral icterus.  Neck: Normal range of motion.  Respiratory: Effort normal. No respiratory distress.  GI: Soft. Bowel sounds are normal. She exhibits no distension. There is no tenderness. There is no rebound.  Genitourinary: Rectum normal. Rectal exam shows no external hemorrhoid and no fissure. Cervix exhibits no motion tenderness. Right  adnexum displays no mass and no tenderness. Left adnexum displays no mass and no tenderness. No bleeding in the vagina. Vaginal discharge found.  Neurological: She is alert and oriented to person, place, and time.  Skin: Skin is warm and dry. She is not diaphoretic.  Psychiatric: She has a normal mood and affect. Her behavior is normal. Judgment and thought content normal.    MAU Course  Procedures Results for orders placed or performed during the hospital encounter of 01/29/18 (from the past 24 hour(s))  Pregnancy, urine POC     Status: None   Collection Time: 01/29/18  6:06 PM  Result Value Ref Range   Preg Test, Ur NEGATIVE NEGATIVE  Urinalysis, Routine w reflex microscopic     Status: Abnormal   Collection Time: 01/29/18  6:13 PM  Result Value Ref Range   Color, Urine YELLOW YELLOW   APPearance HAZY (A) CLEAR   Specific Gravity, Urine 1.020 1.005 - 1.030   pH 5.0 5.0 - 8.0   Glucose, UA NEGATIVE NEGATIVE mg/dL   Hgb urine dipstick NEGATIVE NEGATIVE   Bilirubin Urine NEGATIVE NEGATIVE   Ketones, ur NEGATIVE NEGATIVE mg/dL   Protein, ur NEGATIVE NEGATIVE mg/dL   Nitrite NEGATIVE NEGATIVE   Leukocytes, UA TRACE (A) NEGATIVE   RBC / HPF 0-5 0 - 5 RBC/hpf   WBC, UA 0-5 0 - 5 WBC/hpf   Bacteria, UA NONE SEEN NONE SEEN   Squamous Epithelial / LPF 0-5 0 - 5   Mucus PRESENT   Wet prep, genital     Status: Abnormal   Collection Time: 01/29/18  7:11 PM  Result Value Ref Range   Yeast Wet Prep HPF POC PRESENT (A) NONE SEEN   Trich, Wet Prep NONE SEEN NONE SEEN   Clue Cells Wet Prep HPF POC PRESENT (A) NONE SEEN   WBC, Wet Prep HPF POC MODERATE (A) NONE SEEN   Sperm NONE SEEN     MDM UPT negative Toradol 60 mg IM GC/CT & wet prep   ---- declines blood work for STD testing VSS, NAD. Benign abdominal exam Assessment and Plan  A:  1. BV (bacterial vaginosis)   2. Vaginal yeast infection   3. Pregnancy examination or test, negative result   4. Late menses    P: Discharge  home GC/CT pending Rx diflucan & flagyl Establish with PCP F/u with ED for worsening symptoms  [name] 01/29/2018, 6:40 PM

## 2018-01-29 NOTE — Discharge Instructions (Signed)
[place] (Revised August 2014)    Insufficient Money for Medicine:           [address]: call "211"    MAP Program at [place] or HP [phone]            No Primary Care Doctor:  To locate a primary care doctor that accepts your insurance or provides certain services:           [place]: [phone]           Physician Referral Service: [phone] ask for My [place]  If no insurance, you need to see if you qualify for [facility] orange card, call to set      up appointment for eligibility/enrollment at [phone] or [phone] or visit [place] ([address], [place] and [place]) to meet with a [facility] enrollment specialist.  Agencies that provide inexpensive (sliding fee scale) medical care:       [facility] - Family Medicine at [place] - [phone]     [facility] [place] - [facility] - [place] [phone]     [facility] for Children - [place] [phone]  [facility] - [place] @ [place] [phone]  [facility] - [place] @ [place] - [phone]  [facility]: [phone]   Women's Clinic: [phone]   Planned Parenthood: [phone]   [facility] [place]    [place] Providers:           [facility] [phone] (No Family Planning accepted)          [address], [phone], Mon-Fri 9am-5pm          [facility] - [phone]  [place], [address] [place], Mon-Thursday 8am-5pm, Fri 8am-noon  [employer] -  [place], [address], Mon-Fri 7:30am-4:30pm          [place] - [phone]          [address], [facility] - [phone] [address], [address]          Only accepts [place] [employer] patients after they have their name applied to their card  Self Pay (no insurance) in [facility]:           Sickle Cell Patients:   [place], [phone] [facility]:  [address] [place] [phone]       [facility]  [address] [place] [phone]  [place]:  [address] [phone]          [facility]           [place], [phone] [facility] for [place], [phone]           [facility] [place]           [place], [address], [place] [address]           [phone], Mon-Fri 9am-7pm, [place] 9am-1pm          [facility] - Family Medicine @ [place], [place]          [facility] - [facility]           [address] [place] [facility] Adult and [place]  [address] [place] [phone]          [place], [place]  [facility] - [place]   [place], [place] [phone] [facility] and [place]  [address] [phone]  Dr. Vista Lawman           [address], [place], [facility]           [address] [phone]          [facility]             [address]  [phone]          [facility]           [place], [place], 1st & 3rd Saturday every month, 10am-1pm OTHERS:  [facility]  ([facility] Only)  [phone] (Thursday only) Strategies for finding a Primary Care Provider:  1) Find a Doctor and Pay Out of Pocket  Although you won't have to find out who is covered by your insurance plan, it is a good idea to ask around and get recommendations. You will then need to call the office and see if the doctor you have chosen will accept you as a new patient and what types of options they offer for patients who are self-pay. Some doctors offer discounts or will set up payment plans for their patients who do not have insurance, but you will need to ask so you aren't surprised when you get to your appointment.  2) [place] - To see if you qualify for orange card access to healthcare safety net providers.  Call for appointment for eligibility/enrollment at [phone] or [phone]. (Uninsured, 0-200% FPL, qualifying info)  Applicants for [facility] are first required to see if they are eligible to enroll in the [facility] Marketplace before enrolling in [facility] (and get an exemption if they are not).  GCCN Criteria for acceptance is:  ? Proof of ACA Marketing exemption - form or documentation  ? Valid photo ID (driver's license, state identification card, passport, home country ID)  ? Proof of [facility] residency (e.g. drivers license, lease/landlord information, pay stubs with address, utility bill, bank statement, etc.)  ? Proof of income (1040, last year's tax return, W2, 4 current pay stubs, other income proof)  ? Proof of assets (current bank statement +  3 most recent, disability paperwork, life insurance info, tax value on autos, etc.)  3) [place] Department  Not all health departments have doctors that can see patients for sick visits, but many do, so it is worth a call  to see if yours does. If you don't know where your local health department is, you can check in your phone book. The CDC also has a tool to help you locate your state's health department, and many state websites also have listings of all of their local health departments.  4) Find a [place] [facility]  If your illness is not likely to be very severe or complicated, you may want to try a walk in clinic. These are popping up all over the country in pharmacies, drugstores, and shopping centers. They're usually staffed by nurse practitioners or physician assistants that have been trained to treat common illnesses and complaints. They're usually fairly quick and inexpensive. However, if you have serious medical issues or chronic medical problems, these are probably not your best option   STD Testing:           [place], [place] [facility]           [address] [place], phone [phone] or [phone]           Monday - Friday, call for an appointment          [place], [place] [facility]           [address], [place], phone [phone] or [phone]           Monday - Friday, call for an appointment Abuse/Neglect:           [place]: [place]: [phone] (After Hours) Emergency Shelter:  [facility] [phone]  Ages- [phone]  [place] - [phone]  Youth Focus - Act Together - [phone] (ages 32-17)  [place] @ [employer] - [phone]   Mammograms - Free at [facility] [place]:           Room at the [place]: [phone]   (Homeless mother with children)          [place]: [phone] (Mothers only)  Youth Focus: [phone] (Pregnant 3-21 years old)  Adopt a Mom  [phone]  [facility]   [facility] and [place]  [address] [place] [phone]          [facility] [place]           [address], [place], [place]          [facility].           [place], [place]  [place] [facility]           [phone]          [facility] in [place]           [phone]           [phone] (After Hours)  [place] Abuse Resources:           Alcohol and Drug Services: [phone]           [facility]: [phone]          [facility]: [phone]   Narcotics Helpline - [phone]          [facility]: [phone]           Residential & Outpatient Substance Abuse Program - [place]: [phone]  [employer]  [place] and [place] - [phone] Psychological Services:          [place]: [phone]   Therapeutic Alternatives: [phone]          [place]           [address]: [phone]     (24 Hour)  Mobile Crisis:   [place]:  Radio producer on [place] [phone] [facility] on [place] [phone]  Walk In [place]  ([place] - [phone] or [phone]  [place]. [place] [phone]  [place] [address] [place] [phone]   Dental Assistance:  If unable to pay or uninsured, contact: [facility] to become qualified for the adult dental clinic. Patient must be enrolled in [facility] (uninsured, 0-200% FPL, qualifying info).  Enroll in [facility] first, then see Primary Care Physician assigned to you, the PCP makes a dental referral. [place] [facility] will receive referral and contacts patient for appointment.  Patients with Medicaid           [address], [place] (Children up to 80 + Pregnant Women) - [phone]  [place] - Suite [phone]  If unable to pay, or uninsured: contact [place] [phone] in [place] - ([place] only + Pregnant Women), [phone] in [place] only) to become qualified for the adult dental clinic  Must see if eligible to enroll in [place] before enrolling into the [facility] (exemption required) [phone] for an appointment)  [email or web address];   [phone].  If not eligible for ACA, then go by Department of Health and Human Services to see if eligible for orange card.  [address], [place].  Once you get an orange card, you will have a Primary Care home who will then refer you to dental if needed.  Other IT consultantLow-Cost Community Dental Services:   [facility] [phone]- [phone] (ext [phone])   [address]  Dr. Aidric Endicott [place] - [phone]   [address]    [place] [employer] - [phone]   [address] [facility] [address]           [facility]           [address], [place], [place], [id number]           [phone], Ext. [phone]           2nd and 4th Thursday of the month at 6:30am (Simple extractions only - no wisdom teeth or surgery) First come/First serve -First 10 clients served           [facility] [place]([place], [place] and [place] residents only)          [address], [place], [place], 95[address]           [phone]                    [facility]           [phone]          [facility]          [phone]         [facility] - [facility]          [phone]   Transportation Options:  Ambulance - 911 - [phone] per ride Family Member to accompany patient (if stable) Ginette Otto- [place] [employer] - [phone]  PART - [phone]  Taxi - [phone] - [place]  SCAT - [phone] (Application required)  [facility] - [phone]) [phone]          Bacterial Vaginosis Bacterial vaginosis is an infection of the vagina. It happens when too many germs (bacteria) grow in the vagina. This infection puts you at risk for infections from sex (STIs). Treating this infection can lower your risk for some STIs. You should also treat this if you are pregnant. It can cause your baby to be born early. Follow these instructions at home: Medicines  Take over-the-counter and prescription medicines only as told by your doctor.  Take or use your antibiotic medicine as told by your doctor. Do not stop taking or using it even if you start to feel better. General instructions  If you your sexual partner is a woman, tell her that you have this infection. She needs to get treatment if she has symptoms. If you have a female partner, he does not need to be treated.  During treatment: ? Avoid sex. ? Do not douche. ? Avoid alcohol as told. ? Avoid breastfeeding as told.  Drink enough fluid to keep your pee (urine) clear or pale yellow.  Keep your vagina and butt (rectum) clean. ? Wash the area with warm water every day. ? Wipe from front to back after you use the toilet.  Keep all follow-up visits as told by your doctor. This is important. Preventing this condition  Do not douche.  Use only warm water  to wash around your vagina.  Use protection when you have sex. This includes: ? Latex condoms. ? Dental dams.  Limit how many people you have sex with. It is best to only have sex with the same person (be monogamous).  Get tested for STIs. Have your partner get tested.  Wear underwear that  is cotton or lined with cotton.  Avoid tight pants and pantyhose. This is most important in summer.  Do not use any products that have nicotine or tobacco in them. These include cigarettes and e-cigarettes. If you need help quitting, ask your doctor.  Do not use illegal drugs.  Limit how much alcohol you drink. Contact a doctor if:  Your symptoms do not get better, even after you are treated.  You have more discharge or pain when you pee (urinate).  You have a fever.  You have pain in your belly (abdomen).  You have pain with sex.  Your bleed from your vagina between periods. Summary  This infection happens when too many germs (bacteria) grow in the vagina.  Treating this condition can lower your risk for some infections from sex (STIs).  You should also treat this if you are pregnant. It can cause early (premature) birth.  Do not stop taking or using your antibiotic medicine even if you start to feel better. This information is not intended to replace advice given to you by your health care provider. Make sure you discuss any questions you have with your health care provider. Document Released: 05/15/2008 Document Revised: 04/21/2016 Document Reviewed: 04/21/2016 Elsevier Interactive Patient Education  2017 Elsevier Inc.  Abdominal Pain, Adult Many things can cause belly (abdominal) pain. Most times, belly pain is not dangerous. Many cases of belly pain can be watched and treated at home. Sometimes belly pain is serious, though. Your doctor will try to find the cause of your belly pain. Follow these instructions at home:  Take over-the-counter and prescription medicines only as  told by your doctor. Do not take medicines that help you poop (laxatives) unless told to by your doctor.  Drink enough fluid to keep your pee (urine) clear or pale yellow.  Watch your belly pain for any changes.  Keep all follow-up visits as told by your doctor. This is important. Contact a doctor if:  Your belly pain changes or gets worse.  You are not hungry, or you lose weight without trying.  You are having trouble pooping (constipated) or have watery poop (diarrhea) for more than 2-3 days.  You have pain when you pee or poop.  Your belly pain wakes you up at night.  Your pain gets worse with meals, after eating, or with certain foods.  You are throwing up and cannot keep anything down.  You have a fever. Get help right away if:  Your pain does not go away as soon as your doctor says it should.  You cannot stop throwing up.  Your pain is only in areas of your belly, such as the right side or the left lower part of the belly.  You have bloody or black poop, or poop that looks like tar.  You have very bad pain, cramping, or bloating in your belly.  You have signs of not having enough fluid or water in your body (dehydration), such as: ? Dark pee, very little pee, or no pee. ? Cracked lips. ? Dry mouth. ? Sunken eyes. ? Sleepiness. ? Weakness. This information is not intended to replace advice given to you by your health care provider. Make sure you discuss any questions you have with your health care provider. Document Released: 01/23/2008 Document Revised: 02/24/2016 Document Reviewed: 01/18/2016 Elsevier Interactive Patient Education  2018 [employer].

## 2018-01-30 LAB — GC/CHLAMYDIA PROBE AMP (~~LOC~~) NOT AT ARMC
CHLAMYDIA, DNA PROBE: NEGATIVE
NEISSERIA GONORRHEA: NEGATIVE

## 2019-09-04 ENCOUNTER — Other Ambulatory Visit: Payer: Self-pay | Admitting: Obstetrics & Gynecology

## 2019-09-04 DIAGNOSIS — N9489 Other specified conditions associated with female genital organs and menstrual cycle: Secondary | ICD-10-CM

## 2019-09-24 ENCOUNTER — Other Ambulatory Visit: Payer: Self-pay

## 2019-09-24 ENCOUNTER — Ambulatory Visit
Admission: RE | Admit: 2019-09-24 | Discharge: 2019-09-24 | Disposition: A | Discharge status: Home / Self Care | Payer: Medicaid Other | Source: Ambulatory Visit | Attending: Obstetrics & Gynecology | Admitting: Obstetrics & Gynecology

## 2019-09-24 DIAGNOSIS — N9489 Other specified conditions associated with female genital organs and menstrual cycle: Secondary | ICD-10-CM

## 2019-09-24 MED ORDER — GADOBENATE DIMEGLUMINE 529 MG/ML IV SOLN
18.0000 mL | Freq: Once | INTRAVENOUS | Status: AC | PRN
  Administered 2019-09-24: 18 mL via INTRAVENOUS

## 2021-02-12 ENCOUNTER — Other Ambulatory Visit: Payer: Self-pay

## 2021-02-12 ENCOUNTER — Emergency Department (HOSPITAL_COMMUNITY): Payer: Self-pay

## 2021-02-12 ENCOUNTER — Encounter (HOSPITAL_COMMUNITY): Payer: Self-pay | Admitting: *Deleted

## 2021-02-12 ENCOUNTER — Inpatient Hospital Stay (HOSPITAL_COMMUNITY)
Admission: EM | Admit: 2021-02-12 | Discharge: 2021-02-20 | DRG: 958 | Disposition: A | Discharge status: Rehabilitation Facility | Payer: Self-pay | Attending: Physician Assistant | Admitting: Physician Assistant

## 2021-02-12 DIAGNOSIS — S82871A Displaced pilon fracture of right tibia, initial encounter for closed fracture: Secondary | ICD-10-CM | POA: Diagnosis present

## 2021-02-12 DIAGNOSIS — S62102A Fracture of unspecified carpal bone, left wrist, initial encounter for closed fracture: Secondary | ICD-10-CM

## 2021-02-12 DIAGNOSIS — S2249XA Multiple fractures of ribs, unspecified side, initial encounter for closed fracture: Secondary | ICD-10-CM | POA: Diagnosis present

## 2021-02-12 DIAGNOSIS — S52572A Other intraarticular fracture of lower end of left radius, initial encounter for closed fracture: Secondary | ICD-10-CM | POA: Diagnosis present

## 2021-02-12 DIAGNOSIS — S32424A Nondisplaced fracture of posterior wall of right acetabulum, initial encounter for closed fracture: Secondary | ICD-10-CM | POA: Diagnosis present

## 2021-02-12 DIAGNOSIS — S32409A Unspecified fracture of unspecified acetabulum, initial encounter for closed fracture: Secondary | ICD-10-CM

## 2021-02-12 DIAGNOSIS — R509 Fever, unspecified: Secondary | ICD-10-CM

## 2021-02-12 DIAGNOSIS — S52502A Unspecified fracture of the lower end of left radius, initial encounter for closed fracture: Secondary | ICD-10-CM | POA: Insufficient documentation

## 2021-02-12 DIAGNOSIS — Z20822 Contact with and (suspected) exposure to covid-19: Secondary | ICD-10-CM | POA: Diagnosis present

## 2021-02-12 DIAGNOSIS — Z8261 Family history of arthritis: Secondary | ICD-10-CM

## 2021-02-12 DIAGNOSIS — D62 Acute posthemorrhagic anemia: Secondary | ICD-10-CM | POA: Diagnosis not present

## 2021-02-12 DIAGNOSIS — Z79899 Other long term (current) drug therapy: Secondary | ICD-10-CM

## 2021-02-12 DIAGNOSIS — Z825 Family history of asthma and other chronic lower respiratory diseases: Secondary | ICD-10-CM

## 2021-02-12 DIAGNOSIS — J939 Pneumothorax, unspecified: Secondary | ICD-10-CM

## 2021-02-12 DIAGNOSIS — K5900 Constipation, unspecified: Secondary | ICD-10-CM | POA: Diagnosis not present

## 2021-02-12 DIAGNOSIS — R059 Cough, unspecified: Secondary | ICD-10-CM | POA: Diagnosis not present

## 2021-02-12 DIAGNOSIS — Z23 Encounter for immunization: Secondary | ICD-10-CM | POA: Diagnosis not present

## 2021-02-12 DIAGNOSIS — R52 Pain, unspecified: Secondary | ICD-10-CM | POA: Diagnosis not present

## 2021-02-12 DIAGNOSIS — S270XXA Traumatic pneumothorax, initial encounter: Secondary | ICD-10-CM | POA: Diagnosis present

## 2021-02-12 DIAGNOSIS — S32462A Displaced associated transverse-posterior fracture of left acetabulum, initial encounter for closed fracture: Secondary | ICD-10-CM | POA: Diagnosis not present

## 2021-02-12 DIAGNOSIS — S2242XA Multiple fractures of ribs, left side, initial encounter for closed fracture: Secondary | ICD-10-CM

## 2021-02-12 DIAGNOSIS — S27321A Contusion of lung, unilateral, initial encounter: Secondary | ICD-10-CM | POA: Diagnosis present

## 2021-02-12 DIAGNOSIS — T148XXA Other injury of unspecified body region, initial encounter: Secondary | ICD-10-CM

## 2021-02-12 DIAGNOSIS — Y92410 Unspecified street and highway as the place of occurrence of the external cause: Secondary | ICD-10-CM | POA: Diagnosis not present

## 2021-02-12 DIAGNOSIS — M25579 Pain in unspecified ankle and joints of unspecified foot: Secondary | ICD-10-CM

## 2021-02-12 DIAGNOSIS — M25571 Pain in right ankle and joints of right foot: Secondary | ICD-10-CM

## 2021-02-12 DIAGNOSIS — S32402A Unspecified fracture of left acetabulum, initial encounter for closed fracture: Secondary | ICD-10-CM | POA: Insufficient documentation

## 2021-02-12 DIAGNOSIS — Z419 Encounter for procedure for purposes other than remedying health state, unspecified: Secondary | ICD-10-CM

## 2021-02-12 LAB — ETHANOL: Alcohol, Ethyl (B): <10 mg/dL (ref <10)

## 2021-02-12 LAB — CBC
HCT: 30.2 % — ABNORMAL LOW (ref 36.0–46.0)
HCT: 29.1 % — ABNORMAL LOW (ref 36.0–46.0)
Hemoglobin: 9.1 g/dL — ABNORMAL LOW (ref 12.0–15.0)
Hemoglobin: 8.7 g/dL — ABNORMAL LOW (ref 12.0–15.0)
MCH: 21.4 pg — ABNORMAL LOW (ref 26.0–34.0)
MCH: 21.1 pg — ABNORMAL LOW (ref 26.0–34.0)
MCHC: 30.1 g/dL (ref 30.0–36.0)
MCHC: 29.9 g/dL — ABNORMAL LOW (ref 30.0–36.0)
MCV: 70.9 fL — ABNORMAL LOW (ref 80.0–100.0)
MCV: 70.6 fL — ABNORMAL LOW (ref 80.0–100.0)
Platelets: 133 10*3/uL — ABNORMAL LOW (ref 150–400)
Platelets: 133 10*3/uL — ABNORMAL LOW (ref 150–400)
RBC: 4.26 MIL/uL (ref 3.87–5.11)
RBC: 4.12 MIL/uL (ref 3.87–5.11)
RDW: 17.6 % — ABNORMAL HIGH (ref 11.5–15.5)
RDW: 17.6 % — ABNORMAL HIGH (ref 11.5–15.5)
WBC: 12.5 10*3/uL — ABNORMAL HIGH (ref 4.0–10.5)
WBC: 11.7 10*3/uL — ABNORMAL HIGH (ref 4.0–10.5)
nRBC: 0 % (ref 0.0–0.2)
nRBC: 0 % (ref 0.0–0.2)

## 2021-02-12 LAB — COMPREHENSIVE METABOLIC PANEL
ALT: 37 U/L (ref 0–44)
AST: 77 U/L — ABNORMAL HIGH (ref 15–41)
Albumin: 3.5 g/dL (ref 3.5–5.0)
Alkaline Phosphatase: 48 U/L (ref 38–126)
Anion gap: 8 (ref 5–15)
BUN: 13 mg/dL (ref 6–20)
CO2: 23 mmol/L (ref 22–32)
Calcium: 8.8 mg/dL — ABNORMAL LOW (ref 8.9–10.3)
Chloride: 108 mmol/L (ref 98–111)
Creatinine, Ser: 1.04 mg/dL — ABNORMAL HIGH (ref 0.44–1.00)
GFR, Estimated: >60 mL/min (ref >60)
Glucose, Bld: 148 mg/dL — ABNORMAL HIGH (ref 70–99)
Potassium: 3.6 mmol/L (ref 3.5–5.1)
Sodium: 139 mmol/L (ref 135–145)
Total Bilirubin: 1 mg/dL (ref 0.3–1.2)
Total Protein: 5.9 g/dL — ABNORMAL LOW (ref 6.5–8.1)

## 2021-02-12 LAB — I-STAT CHEM 8, ED
BUN: 13 mg/dL (ref 6–20)
Calcium, Ion: 1.15 mmol/L (ref 1.15–1.40)
Chloride: 107 mmol/L (ref 98–111)
Creatinine, Ser: 1 mg/dL (ref 0.44–1.00)
Glucose, Bld: 150 mg/dL — ABNORMAL HIGH (ref 70–99)
HCT: 29 % — ABNORMAL LOW (ref 36.0–46.0)
Hemoglobin: 9.9 g/dL — ABNORMAL LOW (ref 12.0–15.0)
Potassium: 3.2 mmol/L — ABNORMAL LOW (ref 3.5–5.1)
Sodium: 142 mmol/L (ref 135–145)
TCO2: 23 mmol/L (ref 22–32)

## 2021-02-12 LAB — CREATININE, SERUM
Creatinine, Ser: 0.8 mg/dL (ref 0.44–1.00)
GFR, Estimated: >60 mL/min (ref >60)

## 2021-02-12 LAB — RESP PANEL BY RT-PCR (FLU A&B, COVID) ARPGX2
Influenza A by PCR: NEGATIVE
Influenza B by PCR: NEGATIVE
SARS Coronavirus 2 by RT PCR: NEGATIVE

## 2021-02-12 LAB — PROTIME-INR
INR: 1 (ref 0.8–1.2)
Prothrombin Time: 13.6 seconds (ref 11.4–15.2)

## 2021-02-12 LAB — SAMPLE TO BLOOD BANK

## 2021-02-12 LAB — HCG, QUANTITATIVE, PREGNANCY: hCG, Beta Chain, Quant, S: 1 m[IU]/mL (ref <5)

## 2021-02-12 LAB — HIV ANTIBODY (ROUTINE TESTING W REFLEX): HIV Screen 4th Generation wRfx: NONREACTIVE

## 2021-02-12 LAB — LACTIC ACID, PLASMA: Lactic Acid, Venous: 2.4 mmol/L (ref 0.5–1.9)

## 2021-02-12 MED ORDER — OXYCODONE HCL 5 MG PO TABS: 5.0000 mg | ORAL_TABLET | ORAL | Status: DC | PRN

## 2021-02-12 MED ORDER — FENTANYL CITRATE (PF) 100 MCG/2ML IJ SOLN
50.0000 ug | Freq: Once | INTRAMUSCULAR | Status: AC
  Administered 2021-02-12: 50 ug via INTRAVENOUS
  Filled 2021-02-12: qty 2

## 2021-02-12 MED ORDER — OXYCODONE HCL 5 MG PO TABS
10.0000 mg | ORAL_TABLET | ORAL | Status: DC | PRN
  Filled 2021-02-12: qty 2

## 2021-02-12 MED ORDER — ONDANSETRON HCL 4 MG/2ML IJ SOLN
4.0000 mg | Freq: Four times a day (QID) | INTRAMUSCULAR | Status: DC | PRN
  Administered 2021-02-12: 4 mg via INTRAVENOUS
  Filled 2021-02-12: qty 2

## 2021-02-12 MED ORDER — HYDROMORPHONE HCL 1 MG/ML IJ SOLN
0.5000 mg | INTRAMUSCULAR | Status: DC | PRN
  Administered 2021-02-12 – 2021-02-13 (×4): 0.5 mg via INTRAVENOUS
  Filled 2021-02-12 (×2): qty 0.5
  Filled 2021-02-12: qty 1
  Filled 2021-02-12: qty 0.5

## 2021-02-12 MED ORDER — LACTATED RINGERS IV SOLN: INTRAVENOUS | Status: DC

## 2021-02-12 MED ORDER — LACTATED RINGERS IV BOLUS
1000.0000 mL | Freq: Once | INTRAVENOUS | Status: AC
  Administered 2021-02-12: 1000 mL via INTRAVENOUS

## 2021-02-12 MED ORDER — ENOXAPARIN SODIUM 30 MG/0.3ML IJ SOSY
30.0000 mg | PREFILLED_SYRINGE | Freq: Two times a day (BID) | INTRAMUSCULAR | Status: DC
  Administered 2021-02-13 – 2021-02-14 (×3): 30 mg via SUBCUTANEOUS
  Filled 2021-02-12 (×3): qty 0.3

## 2021-02-12 MED ORDER — IOHEXOL 350 MG/ML SOLN
100.0000 mL | Freq: Once | INTRAVENOUS | Status: AC | PRN
  Administered 2021-02-12: 100 mL via INTRAVENOUS

## 2021-02-12 MED ORDER — ACETAMINOPHEN 325 MG PO TABS
650.0000 mg | ORAL_TABLET | Freq: Four times a day (QID) | ORAL | Status: DC
  Administered 2021-02-12 (×2): 650 mg via ORAL
  Filled 2021-02-12 (×3): qty 2

## 2021-02-12 MED ORDER — HYDROMORPHONE HCL 1 MG/ML IJ SOLN
1.0000 mg | Freq: Once | INTRAMUSCULAR | Status: AC
  Administered 2021-02-12: 1 mg via INTRAVENOUS
  Filled 2021-02-12: qty 1

## 2021-02-12 MED ORDER — ONDANSETRON 4 MG PO TBDP: 4.0000 mg | ORAL_TABLET | Freq: Four times a day (QID) | ORAL | Status: DC | PRN

## 2021-02-12 MED ORDER — TETANUS-DIPHTH-ACELL PERTUSSIS 5-2.5-18.5 LF-MCG/0.5 IM SUSY
0.5000 mL | PREFILLED_SYRINGE | Freq: Once | INTRAMUSCULAR | Status: AC
  Administered 2021-02-12: 0.5 mL via INTRAMUSCULAR
  Filled 2021-02-12: qty 0.5
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
## 2021-02-12 NOTE — ED Notes (Signed)
Patient transported to CT 

## 2021-02-12 NOTE — Progress Notes (Signed)
Orthopedic Tech Progress Note Patient Details:  Jennifer Logan [birth year] [id number]  Ortho Devices Type of Ortho Device: Sugartong splint Ortho Device/Splint Location: LUE Ortho Device/Splint Interventions: Ordered, Application, Adjustment   Post Interventions Patient Tolerated: Fair Instructions Provided: Care of device, Poper ambulation with device  Carolanne Mercier 02/12/2021, 12:21 PM

## 2021-02-13 ENCOUNTER — Inpatient Hospital Stay (HOSPITAL_COMMUNITY): Payer: Self-pay

## 2021-02-13 LAB — CBC
HCT: 29.2 % — ABNORMAL LOW (ref 36.0–46.0)
Hemoglobin: 9 g/dL — ABNORMAL LOW (ref 12.0–15.0)
MCH: 21.3 pg — ABNORMAL LOW (ref 26.0–34.0)
MCHC: 30.8 g/dL (ref 30.0–36.0)
MCV: 69 fL — ABNORMAL LOW (ref 80.0–100.0)
Platelets: 127 10*3/uL — ABNORMAL LOW (ref 150–400)
RBC: 4.23 MIL/uL (ref 3.87–5.11)
RDW: 17.6 % — ABNORMAL HIGH (ref 11.5–15.5)
WBC: 7.3 10*3/uL (ref 4.0–10.5)
nRBC: 0 % (ref 0.0–0.2)

## 2021-02-13 LAB — SURGICAL PCR SCREEN
MRSA, PCR: NEGATIVE
Staphylococcus aureus: NEGATIVE

## 2021-02-13 LAB — BASIC METABOLIC PANEL
Anion gap: 8 (ref 5–15)
BUN: 7 mg/dL (ref 6–20)
CO2: 23 mmol/L (ref 22–32)
Calcium: 8.5 mg/dL — ABNORMAL LOW (ref 8.9–10.3)
Chloride: 106 mmol/L (ref 98–111)
Creatinine, Ser: 0.78 mg/dL (ref 0.44–1.00)
GFR, Estimated: >60 mL/min (ref >60)
Glucose, Bld: 103 mg/dL — ABNORMAL HIGH (ref 70–99)
Potassium: 3.8 mmol/L (ref 3.5–5.1)
Sodium: 137 mmol/L (ref 135–145)

## 2021-02-13 MED ORDER — MUPIROCIN 2 % EX OINT
1.0000 "application " | TOPICAL_OINTMENT | Freq: Two times a day (BID) | CUTANEOUS | Status: AC
  Administered 2021-02-13 – 2021-02-17 (×9): 1 via NASAL
  Filled 2021-02-13 (×4): qty 22

## 2021-02-13 MED ORDER — HYDROMORPHONE HCL 1 MG/ML IJ SOLN
1.0000 mg | INTRAMUSCULAR | Status: DC | PRN
  Administered 2021-02-13 – 2021-02-16 (×10): 1 mg via INTRAVENOUS
  Filled 2021-02-13 (×10): qty 1

## 2021-02-13 MED ORDER — LIDOCAINE 5 % EX PTCH
1.0000 | MEDICATED_PATCH | CUTANEOUS | Status: DC
  Administered 2021-02-13 – 2021-02-19 (×6): 1 via TRANSDERMAL
  Filled 2021-02-13 (×6): qty 1

## 2021-02-13 MED ORDER — METHOCARBAMOL 500 MG PO TABS
500.0000 mg | ORAL_TABLET | Freq: Three times a day (TID) | ORAL | Status: DC
  Administered 2021-02-13 – 2021-02-14 (×4): 500 mg via ORAL
  Filled 2021-02-13 (×4): qty 1

## 2021-02-13 MED ORDER — ACETAMINOPHEN 500 MG PO TABS
1000.0000 mg | ORAL_TABLET | Freq: Four times a day (QID) | ORAL | Status: DC
  Administered 2021-02-13 – 2021-02-20 (×16): 1000 mg via ORAL
  Filled 2021-02-13 (×23): qty 2

## 2021-02-13 NOTE — Progress Notes (Signed)
   02/13/21 0350  Provider Notification  Provider Name/Title Janee Morn, MD  Date Provider Notified 02/13/21  Time Provider Notified [phone]  Notification Type Page  Notification Reason Other (Comment) (patient c/o chest pain, 9/10)  Provider response No new orders  Date of Provider Response 02/13/21  Time of Provider Response 0400  Rapid Response Notification  Date Rapid Response Notified 02/13/21  Time Rapid Response Notified 0355   VSS. 123/78, pulse 84, temp 98.3 oral, RR 20, 98% on RA. EKG WNL. Rapid Response at bedside to assess. Recommended to give Dilaugid 0.5 mg IV PRN for pain. Labs drawn, pending results.

## 2021-02-13 NOTE — Significant Event (Signed)
Rapid Response Event Note   Reason for Call :  Chest pain  Initial Focused Assessment:  Pt lying in bed with eyes closed, in no distress. Pt awakens easily, is alert and oriented, and c/o 9/10 L sided chest pain/pressure. Lungs clear. Skin warm and dry. When not stimulated, pt falls back to sleep.  HR-84, BP-123/78, RR-18, SpO2-98% on RA   Interventions:  EKG-NSR Dilaudid for pain (prn order) Plan of Care:  Pt sleeping after interventions. Continue to monitor. Call RRT if further assistance needed.   Event Summary:   MD Notified: Dr. Janee Morn notified by bedside RT Call [phone] Arrival [phone] End [id number]  Terrilyn Saver, RN
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
## 2021-02-13 NOTE — Progress Notes (Signed)
   02/13/21 0350  Provider Notification  Provider Name/Title Janee Morn, MD  Date Provider Notified 02/13/21  Time Provider Notified [phone]  Notification Type Page  Notification Reason Other (Comment) (patient c/o chest pain, 9/10)  Provider response No new orders  Date of Provider Response 02/13/21  Time of Provider Response 0400  Rapid Response Notification  Date Rapid Response Notified 02/13/21  Time Rapid Response Notified 0355   Complained of left chest pain, 9/10. EKG WNL. VSS. Labs drawn, pending results. Dilaudid 0.5 mg IV PRN given per rapid response recommendation.

## 2021-02-13 NOTE — Progress Notes (Signed)
   02/13/21 0430  Vitals  Temp 98.2 F (36.8 C)  Temp Source Oral  BP 125/69  MAP (mmHg) 84  BP Location Right Arm  BP Method Automatic  Patient Position (if appropriate) Lying  Pulse Rate 62  Pulse Rate Source Dinamap  Resp 18  Level of Consciousness  Level of Consciousness Alert  MEWS COLOR  MEWS Score Color Green  Oxygen Therapy  SpO2 97 %  MEWS Score  MEWS Temp 0  MEWS Systolic 0  MEWS Pulse 0  MEWS RR 0  MEWS LOC 0  MEWS Score 0   Denies CP or SOB. Pt states she feels better. Will continue to monitor.

## 2021-02-13 NOTE — Progress Notes (Signed)
Orthopedic Tech Progress Note Patient Details:  Jennifer Logan [birth year] [id number]  RN held while i applied SHORT LEG SPLINT   . Ortho Devices Type of Ortho Device: Cotton web roll, Short leg splint Ortho Device/Splint Location: RLE Ortho Device/Splint Interventions: Ordered, Application   Post Interventions Patient Tolerated: Well Instructions Provided: Care of device  Donald Pore 02/13/2021, 10:52 AM
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
## 2021-02-14 LAB — BASIC METABOLIC PANEL
Anion gap: 6 (ref 5–15)
BUN: 7 mg/dL (ref 6–20)
CO2: 25 mmol/L (ref 22–32)
Calcium: 8.2 mg/dL — ABNORMAL LOW (ref 8.9–10.3)
Chloride: 105 mmol/L (ref 98–111)
Creatinine, Ser: 1 mg/dL (ref 0.44–1.00)
GFR, Estimated: >60 mL/min (ref >60)
Glucose, Bld: 107 mg/dL — ABNORMAL HIGH (ref 70–99)
Potassium: 3.5 mmol/L (ref 3.5–5.1)
Sodium: 136 mmol/L (ref 135–145)

## 2021-02-14 LAB — CBC
HCT: 27.5 % — ABNORMAL LOW (ref 36.0–46.0)
Hemoglobin: 8.3 g/dL — ABNORMAL LOW (ref 12.0–15.0)
MCH: 21 pg — ABNORMAL LOW (ref 26.0–34.0)
MCHC: 30.2 g/dL (ref 30.0–36.0)
MCV: 69.6 fL — ABNORMAL LOW (ref 80.0–100.0)
Platelets: 125 10*3/uL — ABNORMAL LOW (ref 150–400)
RBC: 3.95 MIL/uL (ref 3.87–5.11)
RDW: 17.3 % — ABNORMAL HIGH (ref 11.5–15.5)
WBC: 6.5 10*3/uL (ref 4.0–10.5)
nRBC: 0 % (ref 0.0–0.2)

## 2021-02-14 MED ORDER — TRAMADOL HCL 50 MG PO TABS
50.0000 mg | ORAL_TABLET | Freq: Four times a day (QID) | ORAL | Status: DC | PRN
  Administered 2021-02-14: 100 mg via ORAL
  Administered 2021-02-14: 50 mg via ORAL
  Administered 2021-02-16: 100 mg via ORAL
  Filled 2021-02-14 (×4): qty 1
  Filled 2021-02-14: qty 2

## 2021-02-14 MED ORDER — METHOCARBAMOL 750 MG PO TABS
750.0000 mg | ORAL_TABLET | Freq: Three times a day (TID) | ORAL | Status: DC
  Administered 2021-02-14 – 2021-02-20 (×17): 750 mg via ORAL
  Filled 2021-02-14 (×20): qty 1
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
## 2021-02-15 ENCOUNTER — Inpatient Hospital Stay (HOSPITAL_COMMUNITY): Payer: Self-pay

## 2021-02-15 ENCOUNTER — Inpatient Hospital Stay (HOSPITAL_COMMUNITY): Payer: Self-pay | Admitting: Certified Registered"

## 2021-02-15 ENCOUNTER — Encounter (HOSPITAL_COMMUNITY): Payer: Self-pay

## 2021-02-15 ENCOUNTER — Encounter (HOSPITAL_COMMUNITY): Admission: EM | Disposition: A | Discharge status: Rehabilitation Facility | Payer: Self-pay | Source: Home / Self Care

## 2021-02-15 HISTORY — PX: ORIF TIBIA FRACTURE: SHX5416

## 2021-02-15 HISTORY — PX: OPEN REDUCTION INTERNAL FIXATION (ORIF) DISTAL RADIAL FRACTURE: SHX5989

## 2021-02-15 HISTORY — PX: OPEN REDUCTION INTERNAL FIXATION ACETABULUM FRACTURE POSTERIOR: SHX6833

## 2021-02-15 LAB — BASIC METABOLIC PANEL
Anion gap: 7 (ref 5–15)
BUN: 7 mg/dL (ref 6–20)
CO2: 24 mmol/L (ref 22–32)
Calcium: 8.6 mg/dL — ABNORMAL LOW (ref 8.9–10.3)
Chloride: 103 mmol/L (ref 98–111)
Creatinine, Ser: 0.74 mg/dL (ref 0.44–1.00)
GFR, Estimated: >60 mL/min (ref >60)
Glucose, Bld: 91 mg/dL (ref 70–99)
Potassium: 3.9 mmol/L (ref 3.5–5.1)
Sodium: 134 mmol/L — ABNORMAL LOW (ref 135–145)

## 2021-02-15 LAB — ABO/RH: ABO/RH(D): O POS

## 2021-02-15 LAB — CBC
HCT: 28.8 % — ABNORMAL LOW (ref 36.0–46.0)
Hemoglobin: 9 g/dL — ABNORMAL LOW (ref 12.0–15.0)
MCH: 21.4 pg — ABNORMAL LOW (ref 26.0–34.0)
MCHC: 31.3 g/dL (ref 30.0–36.0)
MCV: 68.4 fL — ABNORMAL LOW (ref 80.0–100.0)
Platelets: 140 10*3/uL — ABNORMAL LOW (ref 150–400)
RBC: 4.21 MIL/uL (ref 3.87–5.11)
RDW: 17.2 % — ABNORMAL HIGH (ref 11.5–15.5)
WBC: 5.3 10*3/uL (ref 4.0–10.5)
nRBC: 0 % (ref 0.0–0.2)

## 2021-02-15 LAB — PREPARE RBC (CROSSMATCH)

## 2021-02-15 SURGERY — OPEN REDUCTION INTERNAL FIXATION ACETABULUM FRACTURE POSTERIOR
Anesthesia: General | Laterality: Right

## 2021-02-15 MED ORDER — FENTANYL CITRATE (PF) 100 MCG/2ML IJ SOLN
INTRAMUSCULAR | Status: AC
  Filled 2021-02-15: qty 2

## 2021-02-15 MED ORDER — METOCLOPRAMIDE HCL 5 MG/ML IJ SOLN
5.0000 mg | Freq: Three times a day (TID) | INTRAMUSCULAR | Status: DC | PRN
  Administered 2021-02-17 – 2021-02-18 (×2): 10 mg via INTRAVENOUS
  Filled 2021-02-15 (×5): qty 2

## 2021-02-15 MED ORDER — OXYCODONE HCL 5 MG PO TABS
5.0000 mg | ORAL_TABLET | ORAL | Status: DC | PRN
  Filled 2021-02-15 (×2): qty 2

## 2021-02-15 MED ORDER — ACETAMINOPHEN 325 MG PO TABS: 325.0000 mg | ORAL_TABLET | ORAL | Status: DC | PRN

## 2021-02-15 MED ORDER — CEFAZOLIN SODIUM-DEXTROSE 2-4 GM/100ML-% IV SOLN
2.0000 g | Freq: Three times a day (TID) | INTRAVENOUS | Status: AC
  Administered 2021-02-15 – 2021-02-16 (×3): 2 g via INTRAVENOUS
  Filled 2021-02-15 (×3): qty 100

## 2021-02-15 MED ORDER — ORAL CARE MOUTH RINSE: 15.0000 mL | Freq: Once | OROMUCOSAL | Status: AC

## 2021-02-15 MED ORDER — OXYCODONE HCL 5 MG PO TABS: 5.0000 mg | ORAL_TABLET | Freq: Once | ORAL | Status: DC | PRN

## 2021-02-15 MED ORDER — LACTATED RINGERS IV SOLN: INTRAVENOUS | Status: DC

## 2021-02-15 MED ORDER — LIDOCAINE 2% (20 MG/ML) 5 ML SYRINGE
INTRAMUSCULAR | Status: DC | PRN
  Administered 2021-02-15: 100 mg via INTRAVENOUS

## 2021-02-15 MED ORDER — TRANEXAMIC ACID-NACL 1000-0.7 MG/100ML-% IV SOLN
INTRAVENOUS | Status: AC
  Filled 2021-02-15: qty 100

## 2021-02-15 MED ORDER — ACETAMINOPHEN 160 MG/5ML PO SOLN: 325.0000 mg | ORAL | Status: DC | PRN

## 2021-02-15 MED ORDER — TRANEXAMIC ACID-NACL 1000-0.7 MG/100ML-% IV SOLN
1000.0000 mg | Freq: Once | INTRAVENOUS | Status: AC
  Administered 2021-02-15: 1000 mg via INTRAVENOUS
  Filled 2021-02-15: qty 100

## 2021-02-15 MED ORDER — HYDROMORPHONE HCL 1 MG/ML IJ SOLN: 0.5000 mg | INTRAMUSCULAR | Status: DC | PRN

## 2021-02-15 MED ORDER — TRANEXAMIC ACID-NACL 1000-0.7 MG/100ML-% IV SOLN
INTRAVENOUS | Status: DC | PRN
  Administered 2021-02-15: 1000 mg via INTRAVENOUS

## 2021-02-15 MED ORDER — SUGAMMADEX SODIUM 200 MG/2ML IV SOLN
INTRAVENOUS | Status: DC | PRN
  Administered 2021-02-15: 200 mg via INTRAVENOUS

## 2021-02-15 MED ORDER — PROPOFOL 10 MG/ML IV BOLUS
INTRAVENOUS | Status: DC | PRN
  Administered 2021-02-15: 150 mg via INTRAVENOUS

## 2021-02-15 MED ORDER — DEXAMETHASONE SODIUM PHOSPHATE 10 MG/ML IJ SOLN
INTRAMUSCULAR | Status: AC
  Filled 2021-02-15: qty 2

## 2021-02-15 MED ORDER — CHLORHEXIDINE GLUCONATE CLOTH 2 % EX PADS
6.0000 | MEDICATED_PAD | Freq: Every day | CUTANEOUS | Status: DC
  Administered 2021-02-15 – 2021-02-20 (×6): 6 via TOPICAL

## 2021-02-15 MED ORDER — MEPERIDINE HCL 25 MG/ML IJ SOLN: 6.2500 mg | INTRAMUSCULAR | Status: DC | PRN

## 2021-02-15 MED ORDER — VANCOMYCIN HCL 1 G IV SOLR
INTRAVENOUS | Status: DC | PRN
  Administered 2021-02-15: 1000 mg via TOPICAL

## 2021-02-15 MED ORDER — DEXAMETHASONE SODIUM PHOSPHATE 10 MG/ML IJ SOLN
INTRAMUSCULAR | Status: DC | PRN
  Administered 2021-02-15: 10 mg via INTRAVENOUS

## 2021-02-15 MED ORDER — FENTANYL CITRATE (PF) 250 MCG/5ML IJ SOLN
INTRAMUSCULAR | Status: AC
  Filled 2021-02-15: qty 5

## 2021-02-15 MED ORDER — ENOXAPARIN SODIUM 30 MG/0.3ML IJ SOSY: 30.0000 mg | PREFILLED_SYRINGE | Freq: Two times a day (BID) | INTRAMUSCULAR | Status: DC

## 2021-02-15 MED ORDER — FENTANYL CITRATE (PF) 100 MCG/2ML IJ SOLN
25.0000 ug | INTRAMUSCULAR | Status: DC | PRN
  Administered 2021-02-15 (×3): 25 ug via INTRAVENOUS

## 2021-02-15 MED ORDER — ESMOLOL HCL 100 MG/10ML IV SOLN
INTRAVENOUS | Status: AC
  Filled 2021-02-15: qty 20

## 2021-02-15 MED ORDER — OXYCODONE HCL 5 MG/5ML PO SOLN: 5.0000 mg | Freq: Once | ORAL | Status: DC | PRN

## 2021-02-15 MED ORDER — POLYETHYLENE GLYCOL 3350 17 G PO PACK: 17.0000 g | PACK | Freq: Every day | ORAL | Status: DC | PRN

## 2021-02-15 MED ORDER — FENTANYL CITRATE (PF) 100 MCG/2ML IJ SOLN: INTRAMUSCULAR | Status: DC | PRN

## 2021-02-15 MED ORDER — CEFAZOLIN SODIUM-DEXTROSE 2-4 GM/100ML-% IV SOLN
INTRAVENOUS | Status: AC
  Filled 2021-02-15: qty 100

## 2021-02-15 MED ORDER — GLYCOPYRROLATE 0.2 MG/ML IJ SOLN
INTRAMUSCULAR | Status: DC | PRN
  Administered 2021-02-15: .2 mg via INTRAVENOUS

## 2021-02-15 MED ORDER — ONDANSETRON HCL 4 MG/2ML IJ SOLN: 4.0000 mg | Freq: Four times a day (QID) | INTRAMUSCULAR | Status: DC | PRN

## 2021-02-15 MED ORDER — ALBUMIN HUMAN 5 % IV SOLN: INTRAVENOUS | Status: DC | PRN

## 2021-02-15 MED ORDER — METOCLOPRAMIDE HCL 5 MG PO TABS: 5.0000 mg | ORAL_TABLET | Freq: Three times a day (TID) | ORAL | Status: DC | PRN

## 2021-02-15 MED ORDER — CHLORHEXIDINE GLUCONATE 0.12 % MT SOLN
15.0000 mL | Freq: Once | OROMUCOSAL | Status: AC
  Administered 2021-02-15: 15 mL via OROMUCOSAL
  Filled 2021-02-15: qty 15

## 2021-02-15 MED ORDER — OXYCODONE HCL 5 MG PO TABS
10.0000 mg | ORAL_TABLET | ORAL | Status: DC | PRN
  Administered 2021-02-15 – 2021-02-18 (×8): 15 mg via ORAL
  Administered 2021-02-20 (×2): 10 mg via ORAL
  Filled 2021-02-15 (×5): qty 3
  Filled 2021-02-15: qty 2
  Filled 2021-02-15 (×4): qty 3

## 2021-02-15 MED ORDER — LABETALOL HCL 5 MG/ML IV SOLN
INTRAVENOUS | Status: DC | PRN
  Administered 2021-02-15: 5 mg via INTRAVENOUS

## 2021-02-15 MED ORDER — ONDANSETRON HCL 4 MG/2ML IJ SOLN: 4.0000 mg | Freq: Once | INTRAMUSCULAR | Status: DC | PRN

## 2021-02-15 MED ORDER — ONDANSETRON HCL 4 MG/2ML IJ SOLN
INTRAMUSCULAR | Status: DC | PRN
  Administered 2021-02-15: 4 mg via INTRAVENOUS

## 2021-02-15 MED ORDER — 0.9 % SODIUM CHLORIDE (POUR BTL) OPTIME
TOPICAL | Status: DC | PRN
  Administered 2021-02-15: 1000 mL

## 2021-02-15 MED ORDER — DOCUSATE SODIUM 100 MG PO CAPS
100.0000 mg | ORAL_CAPSULE | Freq: Two times a day (BID) | ORAL | Status: DC
  Administered 2021-02-16 – 2021-02-20 (×9): 100 mg via ORAL
  Filled 2021-02-15 (×9): qty 1

## 2021-02-15 MED ORDER — ESMOLOL HCL 100 MG/10ML IV SOLN
INTRAVENOUS | Status: DC | PRN
  Administered 2021-02-15 (×2): 30 mg via INTRAVENOUS

## 2021-02-15 MED ORDER — ONDANSETRON HCL 4 MG PO TABS: 4.0000 mg | ORAL_TABLET | Freq: Four times a day (QID) | ORAL | Status: DC | PRN

## 2021-02-15 MED ORDER — DEXMEDETOMIDINE (PRECEDEX) IN NS 20 MCG/5ML (4 MCG/ML) IV SYRINGE
PREFILLED_SYRINGE | INTRAVENOUS | Status: DC | PRN
  Administered 2021-02-15: 8 ug via INTRAVENOUS
  Administered 2021-02-15: 12 ug via INTRAVENOUS

## 2021-02-15 MED ORDER — FENTANYL CITRATE (PF) 250 MCG/5ML IJ SOLN
INTRAMUSCULAR | Status: DC | PRN
  Administered 2021-02-15 (×10): 50 ug via INTRAVENOUS

## 2021-02-15 MED ORDER — MIDAZOLAM HCL 5 MG/5ML IJ SOLN
INTRAMUSCULAR | Status: DC | PRN
  Administered 2021-02-15: 2 mg via INTRAVENOUS

## 2021-02-15 MED ORDER — ONDANSETRON HCL 4 MG/2ML IJ SOLN
INTRAMUSCULAR | Status: AC
  Filled 2021-02-15: qty 4

## 2021-02-15 MED ORDER — CEFAZOLIN SODIUM-DEXTROSE 2-3 GM-%(50ML) IV SOLR
INTRAVENOUS | Status: DC | PRN
  Administered 2021-02-15: 2 g via INTRAVENOUS

## 2021-02-15 MED ORDER — LIDOCAINE 2% (20 MG/ML) 5 ML SYRINGE
INTRAMUSCULAR | Status: AC
  Filled 2021-02-15: qty 5

## 2021-02-15 MED ORDER — SODIUM CHLORIDE 0.9 % IR SOLN
Status: DC | PRN
  Administered 2021-02-15: 3000 mL

## 2021-02-15 MED ORDER — VANCOMYCIN HCL 1000 MG IV SOLR
INTRAVENOUS | Status: AC
  Filled 2021-02-15: qty 3000

## 2021-02-15 MED ORDER — ROCURONIUM BROMIDE 100 MG/10ML IV SOLN
INTRAVENOUS | Status: DC | PRN
  Administered 2021-02-15 (×2): 20 mg via INTRAVENOUS
  Administered 2021-02-15: 100 mg via INTRAVENOUS
  Administered 2021-02-15 (×2): 20 mg via INTRAVENOUS

## 2021-02-15 SURGICAL SUPPLY — 142 items
ADH SKN CLS LQ APL DERMABOND (GAUZE/BANDAGES/DRESSINGS) ×4
APL PRP STRL LF DISP 70% ISPRP (MISCELLANEOUS) ×2
BAG COUNTER SPONGE SURGICOUNT (BAG) IMPLANT
BAG SPNG CNTER NS LX DISP (BAG)
BANDAGE ESMARK 6X9 LF (GAUZE/BANDAGES/DRESSINGS) IMPLANT
BIT DRILL 2.5 X LONG (BIT) ×2
BIT DRILL 2.5X300 (BIT) IMPLANT
BIT DRILL CALIBRATED 1.8MM (BIT) IMPLANT
BIT DRILL CANN 4.5MM (BIT) IMPLANT
BIT DRILL LCP QC 2X140 (BIT) ×1 IMPLANT
BIT DRILL QC 3.5X195 (BIT) ×1 IMPLANT
BIT DRILL X LONG 2.5 (BIT) IMPLANT
BLADE CLIPPER SURG (BLADE) IMPLANT
BLADE SURG 11 STRL SS (BLADE) ×3 IMPLANT
BNDG CMPR 9X6 STRL LF SNTH (GAUZE/BANDAGES/DRESSINGS)
BNDG ELASTIC 3X5.8 VLCR STR LF (GAUZE/BANDAGES/DRESSINGS) ×1 IMPLANT
BNDG ELASTIC 4X5.8 VLCR STR LF (GAUZE/BANDAGES/DRESSINGS) ×3 IMPLANT
BNDG ELASTIC 6X5.8 VLCR STR LF (GAUZE/BANDAGES/DRESSINGS) ×2 IMPLANT
BNDG ESMARK 6X9 LF (GAUZE/BANDAGES/DRESSINGS)
BNDG GAUZE ELAST 4 BULKY (GAUZE/BANDAGES/DRESSINGS) ×2 IMPLANT
BRUSH SCRUB EZ PLAIN DRY (MISCELLANEOUS) ×7 IMPLANT
CHLORAPREP W/TINT 26 (MISCELLANEOUS) ×5 IMPLANT
CLSR STERI-STRIP ANTIMIC 1/2X4 (GAUZE/BANDAGES/DRESSINGS) ×1 IMPLANT
COVER SURGICAL LIGHT HANDLE (MISCELLANEOUS) ×4 IMPLANT
CUFF TOURN SGL QUICK 34 (TOURNIQUET CUFF)
CUFF TRNQT CYL 34X4.125X (TOURNIQUET CUFF) ×2 IMPLANT
DEFOGGER ANTIFOG KIT (MISCELLANEOUS) ×2 IMPLANT
DERMABOND ADHESIVE PROPEN (GAUZE/BANDAGES/DRESSINGS) ×2
DERMABOND ADVANCED .7 DNX6 (GAUZE/BANDAGES/DRESSINGS) IMPLANT
DISTAL RADIUS HEAD 6H 3HOLE SH (Orthopedic Implant) ×3 IMPLANT
DRAPE C-ARM 42X72 X-RAY (DRAPES) ×4 IMPLANT
DRAPE C-ARMOR (DRAPES) ×3 IMPLANT
DRAPE INCISE IOBAN 66X45 STRL (DRAPES) ×1 IMPLANT
DRAPE INCISE IOBAN 85X60 (DRAPES) ×3 IMPLANT
DRAPE ORTHO SPLIT 77X108 STRL (DRAPES) ×6
DRAPE SURG 17X23 STRL (DRAPES) ×18 IMPLANT
DRAPE SURG ORHT 6 SPLT 77X108 (DRAPES) ×4 IMPLANT
DRAPE U-SHAPE 47X51 STRL (DRAPES) ×4 IMPLANT
DRESSING MEPILEX FLEX 4X4 (GAUZE/BANDAGES/DRESSINGS) IMPLANT
DRILL BIT 2.5X300 (BIT) ×3
DRILL BIT CANN 4.5MM (BIT) ×1
DRILL BIT X LONG 2.5 (BIT) ×3
DRILL CALIBRATED 1.8MM (BIT) ×3
DRSG ADAPTIC 3X8 NADH LF (GAUZE/BANDAGES/DRESSINGS) ×2 IMPLANT
DRSG MEPILEX BORDER 4X12 (GAUZE/BANDAGES/DRESSINGS) ×1 IMPLANT
DRSG MEPILEX BORDER 4X8 (GAUZE/BANDAGES/DRESSINGS) ×1 IMPLANT
DRSG MEPILEX FLEX 4X4 (GAUZE/BANDAGES/DRESSINGS) ×3
DRSG MEPITEL 4X7.2 (GAUZE/BANDAGES/DRESSINGS) ×1 IMPLANT
DRSG PAD ABDOMINAL 8X10 ST (GAUZE/BANDAGES/DRESSINGS) ×8 IMPLANT
ELECT BLADE 4.0 EZ CLEAN MEGAD (MISCELLANEOUS)
ELECT BLADE 6.5 EXT (BLADE) ×1 IMPLANT
ELECT REM PT RETURN 9FT ADLT (ELECTROSURGICAL) ×6
ELECTRODE BLDE 4.0 EZ CLN MEGD (MISCELLANEOUS) ×2 IMPLANT
ELECTRODE REM PT RTRN 9FT ADLT (ELECTROSURGICAL) ×2 IMPLANT
GAUZE SPONGE 4X4 12PLY STRL (GAUZE/BANDAGES/DRESSINGS) ×4 IMPLANT
GLOVE SURG ENC MOIS LTX SZ6.5 (GLOVE) ×9 IMPLANT
GLOVE SURG ENC MOIS LTX SZ7.5 (GLOVE) ×12 IMPLANT
GLOVE SURG UNDER POLY LF SZ6.5 (GLOVE) ×3 IMPLANT
GLOVE SURG UNDER POLY LF SZ7.5 (GLOVE) ×3 IMPLANT
GLOVE XGUARD RR 2 7.5 (GLOVE) ×2 IMPLANT
GLOVE XGUARD RR2 7.5 (GLOVE) ×3
GOWN STRL REUS W/ TWL LRG LVL3 (GOWN DISPOSABLE) ×4 IMPLANT
GOWN STRL REUS W/TWL LRG LVL3 (GOWN DISPOSABLE) ×12
GUIDEWIRE 2.0MM (WIRE) ×2 IMPLANT
GUIDEWIRE THREADED 2.8MM (WIRE) ×2 IMPLANT
HANDPIECE INTERPULSE COAX TIP (DISPOSABLE) ×3
HEAD RADIUS DISTAL 3HOLE SH 6H (Orthopedic Implant) IMPLANT
KIT BASIN OR (CUSTOM PROCEDURE TRAY) ×3 IMPLANT
KIT TURNOVER KIT B (KITS) ×3 IMPLANT
MANIFOLD NEPTUNE II (INSTRUMENTS) ×4 IMPLANT
NS IRRIG 1000ML POUR BTL (IV SOLUTION) ×3 IMPLANT
PACK TOTAL JOINT (CUSTOM PROCEDURE TRAY) ×3 IMPLANT
PACK UNIVERSAL I (CUSTOM PROCEDURE TRAY) ×3 IMPLANT
PAD ARMBOARD 7.5X6 YLW CONV (MISCELLANEOUS) ×6 IMPLANT
PAD CAST 3X4 CTTN HI CHSV (CAST SUPPLIES) IMPLANT
PAD CAST 4YDX4 CTTN HI CHSV (CAST SUPPLIES) ×2 IMPLANT
PADDING CAST COTTON 3X4 STRL (CAST SUPPLIES) ×3
PADDING CAST COTTON 4X4 STRL (CAST SUPPLIES) ×3
PADDING CAST COTTON 6X4 STRL (CAST SUPPLIES) ×2 IMPLANT
PENCIL BUTTON HOLSTER BLD 10FT (ELECTRODE) ×1 IMPLANT
PLATE BONE 91MM 7HOLE PELVIC (Plate) ×1 IMPLANT
PLATE BONE LOCK 65MM 5 HOLE (Plate) ×1 IMPLANT
PLATE MEDIAL HOLE 8 (Plate) ×1 IMPLANT
RETRIEVER SUT HEWSON (MISCELLANEOUS) ×1 IMPLANT
SCREW BONE CORTEX 3.5 46MM (Screw) ×1 IMPLANT
SCREW CANN 6.5X110 32MM THRD (Screw) ×1 IMPLANT
SCREW CORTEX 2.4X22MM (Screw) ×1 IMPLANT
SCREW CORTEX 3.5 18MM (Screw) ×1 IMPLANT
SCREW CORTEX 3.5 20MM (Screw) ×1 IMPLANT
SCREW CORTEX 3.5 30MM (Screw) ×1 IMPLANT
SCREW CORTEX 3.5 32MM (Screw) ×2 IMPLANT
SCREW CORTEX 3.5 40MM (Screw) ×1 IMPLANT
SCREW CORTEX 3.5 65MM (Screw) ×1 IMPLANT
SCREW CORTEX LOW PRO 3.5X28 (Screw) ×2 IMPLANT
SCREW CORTEX LOW PRO 3.5X30 (Screw) ×1 IMPLANT
SCREW CORTEX SLFTPNG 24MM 2.4 (Screw) ×1 IMPLANT
SCREW LOCK CORT ST 3.5X18 (Screw) IMPLANT
SCREW LOCK CORT ST 3.5X20 (Screw) IMPLANT
SCREW LOCK CORT ST 3.5X30 (Screw) IMPLANT
SCREW LOCK CORT ST 3.5X32 (Screw) IMPLANT
SCREW LOCK CORT ST 3.5X40 (Screw) IMPLANT
SCREW LOCK T8 20X2.4XSTVA (Screw) IMPLANT
SCREW LOCKING 2.4X20 (Screw) ×9 IMPLANT
SCREW LOCKING 2.4X24MM (Screw) ×2 IMPLANT
SCREW LOCKING VA 2.7X40MM (Screw) ×2 IMPLANT
SCREW LOCKING VA 2.7X46 (Screw) ×1 IMPLANT
SCREW LOCKING VA 2.7X48 (Screw) ×2 IMPLANT
SCREW LOCKING VA 2.7X50MM (Screw) ×1 IMPLANT
SCREW SCHNZ BLNT TRCR PT 5X200 (Screw) IMPLANT
SCREW SELF TAP 14MM (Screw) ×3 IMPLANT
SCRW SCHANZ BLNT TRCR PT 5X200 (Screw) ×3 IMPLANT
SET HNDPC FAN SPRY TIP SCT (DISPOSABLE) ×2 IMPLANT
SPONGE T-LAP 18X18 ~~LOC~~+RFID (SPONGE) IMPLANT
STAPLER VISISTAT 35W (STAPLE) IMPLANT
SUCTION FRAZIER HANDLE 10FR (MISCELLANEOUS) ×1
SUCTION TUBE FRAZIER 10FR DISP (MISCELLANEOUS) ×2 IMPLANT
SUT ETHILON 2 0 FS 18 (SUTURE) ×1 IMPLANT
SUT ETHILON 2 0 PSLX (SUTURE) ×4 IMPLANT
SUT ETHILON 3 0 FSL (SUTURE) ×1 IMPLANT
SUT ETHILON 3 0 PS 1 (SUTURE) IMPLANT
SUT FIBERWIRE #2 38 T-5 BLUE (SUTURE)
SUT MNCRL AB 3-0 PS2 18 (SUTURE) ×3 IMPLANT
SUT MNCRL AB 3-0 PS2 27 (SUTURE) ×2 IMPLANT
SUT MON AB 2-0 CT1 36 (SUTURE) ×2 IMPLANT
SUT PROLENE 0 CT (SUTURE) IMPLANT
SUT VIC AB 0 CT1 27 (SUTURE) ×3
SUT VIC AB 0 CT1 27XBRD ANBCTR (SUTURE) ×2 IMPLANT
SUT VIC AB 1 CT1 18XCR BRD 8 (SUTURE) ×2 IMPLANT
SUT VIC AB 1 CT1 27 (SUTURE)
SUT VIC AB 1 CT1 27XBRD ANBCTR (SUTURE) ×2 IMPLANT
SUT VIC AB 1 CT1 8-18 (SUTURE) ×3
SUT VIC AB 2-0 CT1 27 (SUTURE) ×6
SUT VIC AB 2-0 CT1 TAPERPNT 27 (SUTURE) ×4 IMPLANT
SUT VIC AB 2-0 SH 27 (SUTURE) ×6
SUT VIC AB 2-0 SH 27XBRD (SUTURE) IMPLANT
SUTURE FIBERWR #2 38 T-5 BLUE (SUTURE) IMPLANT
TOWEL GREEN STERILE (TOWEL DISPOSABLE) ×5 IMPLANT
TOWEL GREEN STERILE FF (TOWEL DISPOSABLE) ×6 IMPLANT
TRAY FOLEY MTR SLVR 16FR STAT (SET/KITS/TRAYS/PACK) ×1 IMPLANT
UNDERPAD 30X36 HEAVY ABSORB (UNDERPADS AND DIAPERS) ×4 IMPLANT
WATER STERILE IRR 1000ML POUR (IV SOLUTION) ×6 IMPLANT
YANKAUER SUCT BULB TIP NO VENT (SUCTIONS) ×2 IMPLANT
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
## 2021-02-15 NOTE — Progress Notes (Signed)
   02/15/21 1658  Assess: MEWS Score  Temp 98.7 F (37.1 C)  BP (!) 154/92  Pulse Rate (!) 117  Resp 20  Level of Consciousness Alert  SpO2 100 %  O2 Device Nasal Cannula  Assess: MEWS Score  MEWS Temp 0  MEWS Systolic 0  MEWS Pulse 2  MEWS RR 0  MEWS LOC 0  MEWS Score 2  MEWS Score Color Yellow  Assess: if the MEWS score is Yellow or Red  Were vital signs taken at a resting state? Yes  Focused Assessment No change from prior assessment  MEWS guidelines implemented *See Row Information* Yes  Treat  MEWS Interventions Administered prn meds/treatments  Pain Scale 0-10  Pain Score 9  Pain Type Surgical pain  Pain Location Hip  Pain Orientation Left  Pain Descriptors / Indicators Discomfort;Aching  Pain Frequency Constant  Pain Onset On-going  Patients Stated Pain Goal 3  Pain Intervention(s) Medication (See eMAR)  Take Vital Signs  Increase Vital Sign Frequency  Yellow: Q 2hr X 2 then Q 4hr X 2, if remains yellow, continue Q 4hrs  Escalate  MEWS: Escalate Yellow: discuss with charge nurse/RN and consider discussing with provider and RRT  Notify: Charge Nurse/RN  Name of Charge Nurse/RN Notified Josephine  Date Charge Nurse/RN Notified 02/15/21  Time Charge Nurse/RN Notified 1900  Notify: Provider  Provider Name/Title Dr. Dossie Der  Date Provider Notified 02/15/21  Time Provider Notified 1930  Notification Type Page  Notification Reason Other (Comment) (MEWS Yellow)  Provider response No new orders  Document  Patient Outcome Stabilized after interventions  Progress note created (see row info) Yes
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
## 2021-02-15 NOTE — Transfer of Care (Signed)
Immediate Anesthesia Transfer of Care Note  Patient: Jennifer Logan  Procedure(s) Performed: OPEN REDUCTION INTERNAL FIXATION ACETABULUM FRACTURE POSTERIOR (Left) OPEN REDUCTION INTERNAL FIXATION (ORIF) DISTAL RADIAL FRACTURE (Left) OPEN REDUCTION INTERNAL FIXATION (ORIF) TIBIA FRACTURE (Right)  Patient Location: PACU  Anesthesia Type:General  Level of Consciousness: awake, alert  and oriented  Airway & Oxygen Therapy: Patient Spontanous Breathing and Patient connected to face mask oxygen  Post-op Assessment: Report given to RN and Post -op Vital signs reviewed and stable  Post vital signs: Reviewed and stable  Last Vitals:  Vitals Value Taken Time  BP 123/81 02/15/21 1422  Temp 36.5 C 02/15/21 1422  Pulse 114 02/15/21 1422  Resp 25 02/15/21 1424  SpO2 100 % 02/15/21 1422  Vitals shown include unvalidated device data.  Last Pain:  Vitals:   02/15/21 0735  TempSrc: Oral  PainSc:       Patients Stated Pain Goal: 0 (02/14/21 0058)  Complications: No notable events documented.

## 2021-02-15 NOTE — Anesthesia Preprocedure Evaluation (Addendum)
Anesthesia Evaluation  Patient identified by MRN, date of birth, ID band Patient awake    Reviewed: Allergy & Precautions, H&P , NPO status , Patient's Chart, lab work & pertinent test results, reviewed documented beta blocker date and time   Airway Mallampati: I  TM Distance: >3 FB Neck ROM: full    Dental no notable dental hx. (+) Teeth Intact, Dental Advisory Given, Chipped,    Pulmonary neg pulmonary ROS,    Pulmonary exam normal breath sounds clear to auscultation       Cardiovascular Exercise Tolerance: Good negative cardio ROS   Rhythm:regular Rate:Normal     Neuro/Psych negative neurological ROS  negative psych ROS   GI/Hepatic negative GI ROS, Neg liver ROS,   Endo/Other  negative endocrine ROS  Renal/GU negative Renal ROS  negative genitourinary   Musculoskeletal   Abdominal   Peds  Hematology negative hematology ROS (+)   Anesthesia Other Findings   Reproductive/Obstetrics negative OB ROS                            Anesthesia Physical Anesthesia Plan  ASA: 2  Anesthesia Plan: General   Post-op Pain Management:    Induction: Intravenous  PONV Risk Score and Plan: 3 and Ondansetron, Dexamethasone and Treatment may vary due to age or medical condition  Airway Management Planned: Oral ETT  Additional Equipment: None  Intra-op Plan:   Post-operative Plan: Extubation in OR  Informed Consent: I have reviewed the patients History and Physical, chart, labs and discussed the procedure including the risks, benefits and alternatives for the proposed anesthesia with the patient or authorized representative who has indicated his/her understanding and acceptance.     Dental Advisory Given  Plan Discussed with: CRNA and Anesthesiologist  Anesthesia Plan Comments: (  )       Anesthesia Quick Evaluation
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
## 2021-02-15 NOTE — Anesthesia Procedure Notes (Signed)
Procedure Name: Intubation Date/Time: 02/15/2021 9:14 AM Performed by: Lauralyn Primes, CRNA Pre-anesthesia Checklist: Patient identified, Emergency Drugs available, Suction available and Patient being monitored Patient Re-evaluated:Patient Re-evaluated prior to induction Oxygen Delivery Method: Circle system utilized Preoxygenation: Pre-oxygenation with 100% oxygen Induction Type: IV induction Ventilation: Mask ventilation without difficulty Laryngoscope Size: Glidescope and 3 Grade View: Grade I Tube type: Oral Tube size: 7.0 mm Number of attempts: 1 Airway Equipment and Method: Rigid stylet, Video-laryngoscopy and Bite block Placement Confirmation: ETT inserted through vocal cords under direct vision, positive ETCO2 and breath sounds checked- equal and bilateral Secured at: 22 cm Tube secured with: Tape Dental Injury: Teeth and Oropharynx as per pre-operative assessment

## 2021-02-15 NOTE — Anesthesia Postprocedure Evaluation (Signed)
Anesthesia Post Note  Patient: Jennifer Logan  Procedure(s) Performed: OPEN REDUCTION INTERNAL FIXATION ACETABULUM FRACTURE POSTERIOR (Left) OPEN REDUCTION INTERNAL FIXATION (ORIF) DISTAL RADIAL FRACTURE (Left) OPEN REDUCTION INTERNAL FIXATION (ORIF) TIBIA FRACTURE (Right)     Patient location during evaluation: PACU Anesthesia Type: General Level of consciousness: awake and alert Pain management: pain level controlled Vital Signs Assessment: post-procedure vital signs reviewed and stable Respiratory status: spontaneous breathing, nonlabored ventilation, respiratory function stable and patient connected to nasal cannula oxygen Cardiovascular status: blood pressure returned to baseline and stable Postop Assessment: no apparent nausea or vomiting Anesthetic complications: no   No notable events documented.  Last Vitals:  Vitals:   02/15/21 1437 02/15/21 1452  BP: (!) 152/89 (!) 142/90  Pulse: (!) 108 (!) 107  Resp: (!) 27 (!) 25  Temp:    SpO2: 93% 97%    Last Pain:  Vitals:   02/15/21 1452  TempSrc:   PainSc: 8                  Raynald Rouillard

## 2021-02-16 DIAGNOSIS — S82871A Displaced pilon fracture of right tibia, initial encounter for closed fracture: Secondary | ICD-10-CM

## 2021-02-16 LAB — CBC
HCT: 24.7 % — ABNORMAL LOW (ref 36.0–46.0)
Hemoglobin: 7.7 g/dL — ABNORMAL LOW (ref 12.0–15.0)
MCH: 21.2 pg — ABNORMAL LOW (ref 26.0–34.0)
MCHC: 31.2 g/dL (ref 30.0–36.0)
MCV: 68 fL — ABNORMAL LOW (ref 80.0–100.0)
Platelets: 169 10*3/uL (ref 150–400)
RBC: 3.63 MIL/uL — ABNORMAL LOW (ref 3.87–5.11)
RDW: 17.2 % — ABNORMAL HIGH (ref 11.5–15.5)
WBC: 9.8 10*3/uL (ref 4.0–10.5)
nRBC: 0.3 % — ABNORMAL HIGH (ref 0.0–0.2)

## 2021-02-16 LAB — VITAMIN D 25 HYDROXY (VIT D DEFICIENCY, FRACTURES): Vit D, 25-Hydroxy: 18.05 ng/mL — ABNORMAL LOW (ref 30–100)

## 2021-02-16 LAB — BASIC METABOLIC PANEL
Anion gap: 9 (ref 5–15)
BUN: 7 mg/dL (ref 6–20)
CO2: 24 mmol/L (ref 22–32)
Calcium: 8.8 mg/dL — ABNORMAL LOW (ref 8.9–10.3)
Chloride: 101 mmol/L (ref 98–111)
Creatinine, Ser: 0.84 mg/dL (ref 0.44–1.00)
GFR, Estimated: >60 mL/min (ref >60)
Glucose, Bld: 113 mg/dL — ABNORMAL HIGH (ref 70–99)
Potassium: 4 mmol/L (ref 3.5–5.1)
Sodium: 134 mmol/L — ABNORMAL LOW (ref 135–145)

## 2021-02-16 MED ORDER — ENOXAPARIN SODIUM 30 MG/0.3ML IJ SOSY
30.0000 mg | PREFILLED_SYRINGE | Freq: Two times a day (BID) | INTRAMUSCULAR | Status: DC
  Administered 2021-02-17 – 2021-02-20 (×7): 30 mg via SUBCUTANEOUS
  Filled 2021-02-16 (×7): qty 0.3
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
## 2021-02-16 NOTE — Progress Notes (Deleted)
Rehab Admissions Coordinator Note:  Patient was screened by Clois Dupes for appropriateness for an Inpatient Acute Rehab Consult per therapy recs. Noted patient with significant pain limiting full participation today, weight bearing restrictions with LLE transfers only, WBAT RLE and weight bearing through elbow to LUE. Lives in third floor apartment. I question limitations that CIR admit can provide with prn assistance at home. I will follow her progress but recommend other caregivers supports and dispo be sought.Clois Dupes RN MSN 02/16/2021, 3:47 PM  I can be reached at [phone].
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
## 2021-02-17 ENCOUNTER — Inpatient Hospital Stay (HOSPITAL_COMMUNITY): Payer: Self-pay

## 2021-02-17 ENCOUNTER — Encounter (HOSPITAL_COMMUNITY): Payer: Self-pay | Admitting: Student

## 2021-02-17 LAB — CBC
HCT: 23.7 % — ABNORMAL LOW (ref 36.0–46.0)
Hemoglobin: 7.3 g/dL — ABNORMAL LOW (ref 12.0–15.0)
MCH: 21 pg — ABNORMAL LOW (ref 26.0–34.0)
MCHC: 30.8 g/dL (ref 30.0–36.0)
MCV: 68.3 fL — ABNORMAL LOW (ref 80.0–100.0)
Platelets: 166 10*3/uL (ref 150–400)
RBC: 3.47 MIL/uL — ABNORMAL LOW (ref 3.87–5.11)
RDW: 17.4 % — ABNORMAL HIGH (ref 11.5–15.5)
WBC: 11 10*3/uL — ABNORMAL HIGH (ref 4.0–10.5)
nRBC: 0.2 % (ref 0.0–0.2)

## 2021-02-17 LAB — URINALYSIS, ROUTINE W REFLEX MICROSCOPIC
Bacteria, UA: NONE SEEN
Bilirubin Urine: NEGATIVE
Glucose, UA: NEGATIVE mg/dL
Ketones, ur: 20 mg/dL — AB
Leukocytes,Ua: NEGATIVE
Nitrite: NEGATIVE
Protein, ur: NEGATIVE mg/dL
Specific Gravity, Urine: 1.013 (ref 1.005–1.030)
pH: 7 (ref 5.0–8.0)

## 2021-02-17 LAB — BASIC METABOLIC PANEL
Anion gap: 7 (ref 5–15)
BUN: 7 mg/dL (ref 6–20)
CO2: 25 mmol/L (ref 22–32)
Calcium: 8.6 mg/dL — ABNORMAL LOW (ref 8.9–10.3)
Chloride: 102 mmol/L (ref 98–111)
Creatinine, Ser: 0.72 mg/dL (ref 0.44–1.00)
GFR, Estimated: >60 mL/min (ref >60)
Glucose, Bld: 121 mg/dL — ABNORMAL HIGH (ref 70–99)
Potassium: 3.9 mmol/L (ref 3.5–5.1)
Sodium: 134 mmol/L — ABNORMAL LOW (ref 135–145)

## 2021-02-17 MED ORDER — VITAMIN D 25 MCG (1000 UNIT) PO TABS
2000.0000 [IU] | ORAL_TABLET | Freq: Every day | ORAL | Status: DC
  Administered 2021-02-17 – 2021-02-20 (×4): 2000 [IU] via ORAL
  Filled 2021-02-17 (×4): qty 2

## 2021-02-17 MED ORDER — GUAIFENESIN 100 MG/5ML PO SOLN
5.0000 mL | ORAL | Status: DC | PRN
  Filled 2021-02-17: qty 5

## 2021-02-17 MED ORDER — BISACODYL 10 MG RE SUPP
10.0000 mg | Freq: Every day | RECTAL | Status: DC | PRN
  Filled 2021-02-17: qty 1

## 2021-02-17 MED ORDER — POLYETHYLENE GLYCOL 3350 17 G PO PACK
17.0000 g | PACK | Freq: Two times a day (BID) | ORAL | Status: DC
  Administered 2021-02-17 – 2021-02-20 (×5): 17 g via ORAL
  Filled 2021-02-17 (×7): qty 1
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
## 2021-02-17 NOTE — Progress Notes (Signed)
Patient alert and oriented x 4. No acute distress noted, no complaints voiced by patient. Patient with heart rate 110's -120's this evening calling for yellow MEWS. This is not new for patient. Patient on continuous telemetry monitoring. Will continue to monitor.    02/16/21 2017  Assess: MEWS Score  Temp (!) 100.7 F (38.2 C)  BP 137/78  Pulse Rate (!) 123  Resp 19  SpO2 100 %  O2 Device Nasal Cannula  Assess: MEWS Score  MEWS Temp 1  MEWS Systolic 0  MEWS Pulse 2  MEWS RR 0  MEWS LOC 0  MEWS Score 3  MEWS Score Color Yellow

## 2021-02-17 NOTE — Discharge Instructions (Signed)
Orthopaedic Trauma Service Discharge Instructions   General Discharge Instructions  WEIGHT BEARING STATUS: - LEFT ARM - WEIGHTBEARING AS TOLERATED THROUGH ELBOW, NON-WEIGHTBEARING THROUGH WRIST - LEFT LEG - TOUCHDOWN WEIGHTBEARING  - RIGHT LEG - WEIGHTBEARING FOR TRANSFERS  RANGE OF MOTION/ACTIVITY:  - LEFT ARM - DON'T REMOVE SPLINT, OK FOR UNRESTRICTED ELBOW MOTION - LEFT LEG - OK FOR HIP AND KNEE RANGE OF MOTION AS TOLERATED - RIGHT LEG - OK TO BEGIN SOME GENTLE ANKLE MOTION AS TOLERATED  Wound Care: Incisions on left and right lower extremities can be left open to air if there is no drainage. If incision continues to have drainage, follow wound care instructions below. Okay to shower if no drainage from incisions.  DVT/PE prophylaxis: Lovenox x 30 days  Diet: as you were eating previously.  Can use over the counter stool softeners and bowel preparations, such as Miralax, to help with bowel movements.  Narcotics can be constipating.  Be sure to drink plenty of fluids  PAIN MEDICATION USE AND EXPECTATIONS  You have likely been given narcotic medications to help control your pain.  After a traumatic event that results in an fracture (broken bone) with or without surgery, it is ok to use narcotic pain medications to help control one's pain.  We understand that everyone responds to pain differently and each individual patient will be evaluated on a regular basis for the continued need for narcotic medications. Ideally, narcotic medication use should last no more than 6-8 weeks (coinciding with fracture healing).   As a patient it is your responsibility as well to monitor narcotic medication use and report the amount and frequency you use these medications when you come to your office visit.   We would also advise that if you are using narcotic medications, you should take a dose prior to therapy to maximize you participation.  IF YOU ARE ON NARCOTIC MEDICATIONS IT IS NOT PERMISSIBLE TO  OPERATE A MOTOR VEHICLE (MOTORCYCLE/CAR/TRUCK/MOPED) OR HEAVY MACHINERY DO NOT MIX NARCOTICS WITH OTHER CNS (CENTRAL NERVOUS SYSTEM) DEPRESSANTS SUCH AS ALCOHOL   STOP SMOKING OR USING NICOTINE PRODUCTS!!!!  As discussed nicotine severely impairs your body's ability to heal surgical and traumatic wounds but also impairs bone healing.  Wounds and bone heal by forming microscopic blood vessels (angiogenesis) and nicotine is a vasoconstrictor (essentially, shrinks blood vessels).  Therefore, if vasoconstriction occurs to these microscopic blood vessels they essentially disappear and are unable to deliver necessary nutrients to the healing tissue.  This is one modifiable factor that you can do to dramatically increase your chances of healing your injury.    (This means no smoking, no nicotine gum, patches, etc)  DO NOT USE NONSTEROIDAL ANTI-INFLAMMATORY DRUGS (NSAID'S)  Using products such as Advil (ibuprofen), Aleve (naproxen), Motrin (ibuprofen) for additional pain control during fracture healing can delay and/or prevent the healing response.  If you would like to take over the counter (OTC) medication, Tylenol (acetaminophen) is ok.  However, some narcotic medications that are given for pain control contain acetaminophen as well. Therefore, you should not exceed more than 4000 mg of tylenol in a day if you do not have liver disease.  Also note that there are may OTC medicines, such as cold medicines and allergy medicines that my contain tylenol as well.  If you have any questions about medications and/or interactions please ask your doctor/PA or your pharmacist.      ICE AND ELEVATE INJURED/OPERATIVE EXTREMITY  Using ice and elevating the injured extremity above  your heart can help with swelling and pain control.  Icing in a pulsatile fashion, such as 20 minutes on and 20 minutes off, can be followed.    Do not place ice directly on skin. Make sure there is a barrier between to skin and the ice pack.     Using frozen items such as frozen peas works well as the conform nicely to the are that needs to be iced.  USE AN ACE WRAP OR TED HOSE FOR SWELLING CONTROL  In addition to icing and elevation, Ace wraps or TED hose are used to help limit and resolve swelling.  It is recommended to use Ace wraps or TED hose until you are informed to stop.    When using Ace Wraps start the wrapping distally (farthest away from the body) and wrap proximally (closer to the body)   Example: If you had surgery on your leg or thing and you do not have a splint on, start the ace wrap at the toes and work your way up to the thigh        If you had surgery on your upper extremity and do not have a splint on, start the ace wrap at your fingers and work your way up to the upper arm  IF YOU ARE IN A SPLINT OR CAST DO NOT REMOVE IT FOR ANY REASON   If your splint gets wet for any reason please contact the office immediately. You may shower in your splint or cast as long as you keep it dry.  This can be done by wrapping in a cast cover or garbage back (or similar)  Do Not stick any thing down your splint or cast such as pencils, money, or hangers to try and scratch yourself with.  If you feel itchy take benadryl as prescribed on the bottle for itching  IF YOU ARE IN A CAM BOOT (BLACK BOOT)  You may remove boot periodically. Perform daily dressing changes as noted below.  Wash the liner of the boot regularly and wear a sock when wearing the boot. It is recommended that you sleep in the boot until told otherwise   CALL THE OFFICE WITH ANY QUESTIONS OR CONCERNS: [phone]   VISIT OUR WEBSITE FOR ADDITIONAL INFORMATION: [email or web address]    Discharge Wound Care Instructions  Do NOT apply any ointments, solutions or lotions to pin sites or surgical wounds.  These prevent needed drainage and even though solutions like hydrogen peroxide kill bacteria, they also damage cells lining the pin sites that help fight infection.   Applying lotions or ointments can keep the wounds moist and can cause them to breakdown and open up as well. This can increase the risk for infection. When in doubt call the office.  If any drainage is noted, use one layer of adaptic, then gauze, Kerlix, and an ace wrap.  Once the incision is completely dry and without drainage, it may be left open to air out.  Showering may begin 36-48 hours later.  Cleaning gently with soap and water.

## 2021-02-17 NOTE — OR Nursing (Signed)
After reviewing the x-rays and talking with sales representative, I corrected the implant quantities to match what was used for the patient.
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
## 2021-02-17 NOTE — Progress Notes (Signed)
Orthopaedic Trauma Progress Note  SUBJECTIVE: Feeling discouraged this AM. Having abdominal pain. Hasnt had a bowel movement since admission  OBJECTIVE:  Vitals:   02/17/21 0402 02/17/21 0846  BP: 134/86 (!) 134/91  Pulse: (!) 113 (!) 119  Resp: 18 18  Temp: 99.3 F (37.4 C) (!) 101.1 F (38.4 C)  SpO2: 100% 98%    General: Laying in bed resting comfortably. NAD Respiratory: No increased work of breathing.  LUE: Splint in place is CDI. Able to wiggle fingers. Non-tender above splint. Hand warm and well perfused.  LLE: Incisions CDI. Tender over hip and through thigh. Ankle DF/PF intact. Endorses sensation throughout extremity. +EHL. +FHL. Foot warm and well perfused. +DP pulse RLE: Incisions CDI. Tolerates very small amount of ankle motion. Able to wiggle toes. Endorses sensation throughout extremity. Foot warm and well perfused   IMAGING: Stable post op imaging.   LABS:  Results for orders placed or performed during the hospital encounter of 02/12/21 (from the past 24 hour(s))  Basic metabolic panel     Status: Abnormal   Collection Time: 02/17/21  1:40 AM  Result Value Ref Range   Sodium 134 (L) 135 - 145 mmol/L   Potassium 3.9 3.5 - 5.1 mmol/L   Chloride 102 98 - 111 mmol/L   CO2 25 22 - 32 mmol/L   Glucose, Bld 121 (H) 70 - 99 mg/dL   BUN 7 6 - 20 mg/dL   Creatinine, Ser 6.96 0.44 - 1.00 mg/dL   Calcium 8.6 (L) 8.9 - 10.3 mg/dL   GFR, Estimated >29 >52 mL/min   Anion gap 7 5 - 15  CBC     Status: Abnormal   Collection Time: 02/17/21  1:40 AM  Result Value Ref Range   WBC 11.0 (H) 4.0 - 10.5 K/uL   RBC 3.47 (L) 3.87 - 5.11 MIL/uL   Hemoglobin 7.3 (L) 12.0 - 15.0 g/dL   HCT 84.1 (L) 32.4 - 40.1 %   MCV 68.3 (L) 80.0 - 100.0 fL   MCH 21.0 (L) 26.0 - 34.0 pg   MCHC 30.8 30.0 - 36.0 g/dL   RDW 02.7 (H) 25.3 - 66.4 %   Platelets 166 150 - 400 K/uL   nRBC 0.2 0.0 - 0.2 %    ASSESSMENT: Jennifer Logan is a 30 y.o. female, 2 Days Post-Op s/p  ORIF LEFT ACETABULUM  FRACTURE  ORIF LEFT DISTAL RADIAL FRACTURE ORIF RIGHT DISTAL TIBIA FRACTURE  CV/Blood loss: Acute blood loss anemia, Hgb 7.3 this AM. Hemodynamically stable  PLAN: Weightbearing:  - RLE: WB for transfers only, no walker ambulation - LLE: TDWB  - LUE: WBAT thru elbow, NWB thru wrist  ROM:  - RLE: Ok for gentle passive and active ankle ROM out of the boot - LLE: Ok for hip and knee ROM as tolerated - LUE: Keep splint in place   Incisional and dressing care:  - RLE: Change 02/17/21 - LLE: Change 02/17/21 - LUE: Keep splint in place   Showering: Hold off on showering for now. Will likely allow for shower with assistance starting 02/18/21 if no drainage from incisions  Orthopedic device(s): CAM boot and Splint  Pain management:  1. Tylenol 1000 mg q 6 hours scheduled 2. Robaxin 750 mg TID 3. Oxycodone 5-15 mg q 4 hours PRN VTE prophylaxis: Lovenox for VTE prophylaxis ID:  Ancef 2gm post op Foley/Lines: Foley removed Impediments to Fracture Healing: Polytrauma. Vit D level pending, will start supplementation as indicated Dispo: PT/OT eval today. Continue  to monitor CBC. Plan to change RLE and LLE dressings tomorrow Follow - up plan: 2 weeks after d/c for repeat x-rays  Contact information:  Truitt Merle MD, Ulyses Southward PA-C. After hours and holidays please check [email or web address] for group call information for [facility]  Roby Lofts, MD [facility] [phone] (office) [email or web address]

## 2021-02-18 LAB — BASIC METABOLIC PANEL
Anion gap: 9 (ref 5–15)
BUN: 9 mg/dL (ref 6–20)
CO2: 24 mmol/L (ref 22–32)
Calcium: 8.8 mg/dL — ABNORMAL LOW (ref 8.9–10.3)
Chloride: 100 mmol/L (ref 98–111)
Creatinine, Ser: 0.64 mg/dL (ref 0.44–1.00)
GFR, Estimated: >60 mL/min (ref >60)
Glucose, Bld: 107 mg/dL — ABNORMAL HIGH (ref 70–99)
Potassium: 3.9 mmol/L (ref 3.5–5.1)
Sodium: 133 mmol/L — ABNORMAL LOW (ref 135–145)

## 2021-02-18 LAB — CBC
HCT: 23.7 % — ABNORMAL LOW (ref 36.0–46.0)
Hemoglobin: 7.4 g/dL — ABNORMAL LOW (ref 12.0–15.0)
MCH: 21.2 pg — ABNORMAL LOW (ref 26.0–34.0)
MCHC: 31.2 g/dL (ref 30.0–36.0)
MCV: 67.9 fL — ABNORMAL LOW (ref 80.0–100.0)
Platelets: 160 10*3/uL (ref 150–400)
RBC: 3.49 MIL/uL — ABNORMAL LOW (ref 3.87–5.11)
RDW: 17.4 % — ABNORMAL HIGH (ref 11.5–15.5)
WBC: 8.6 10*3/uL (ref 4.0–10.5)
nRBC: 0 % (ref 0.0–0.2)

## 2021-02-18 MED ORDER — GUAIFENESIN 100 MG/5ML PO SOLN
5.0000 mL | Freq: Four times a day (QID) | ORAL | Status: DC
  Administered 2021-02-18 – 2021-02-20 (×5): 100 mg via ORAL
  Filled 2021-02-18 (×11): qty 5

## 2021-02-18 MED ORDER — MAGNESIUM CITRATE PO SOLN
1.0000 | Freq: Once | ORAL | Status: AC
  Administered 2021-02-18: 1 via ORAL
  Filled 2021-02-18: qty 296
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
## 2021-02-19 ENCOUNTER — Inpatient Hospital Stay (HOSPITAL_COMMUNITY): Payer: Self-pay

## 2021-02-19 LAB — BPAM RBC
Blood Product Expiration Date: 202208012359
Blood Product Expiration Date: 202208012359
Unit Type and Rh: 5100
Unit Type and Rh: 5100

## 2021-02-19 LAB — TYPE AND SCREEN
ABO/RH(D): O POS
Antibody Screen: NEGATIVE
Unit division: 0
Unit division: 0

## 2021-02-19 LAB — CREATININE, SERUM
Creatinine, Ser: 0.66 mg/dL (ref 0.44–1.00)
GFR, Estimated: >60 mL/min (ref >60)

## 2021-02-19 LAB — EXPECTORATED SPUTUM ASSESSMENT W GRAM STAIN, RFLX TO RESP C

## 2021-02-19 MED ORDER — WHITE PETROLATUM EX OINT
TOPICAL_OINTMENT | CUTANEOUS | Status: AC
  Administered 2021-02-19: 0.2
  Filled 2021-02-19: qty 28.35

## 2021-02-19 MED ORDER — LACTATED RINGERS IV SOLN: INTRAVENOUS | Status: DC

## 2021-02-19 MED ORDER — IOHEXOL 9 MG/ML PO SOLN
ORAL | Status: AC
  Filled 2021-02-19: qty 1000
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
## 2021-02-20 ENCOUNTER — Inpatient Hospital Stay (HOSPITAL_COMMUNITY)
Admission: RE | Admit: 2021-02-20 | Discharge: 2021-03-09 | DRG: 560 | Disposition: A | Discharge status: Home / Self Care | Payer: Self-pay | Source: Intra-hospital | Attending: Physical Medicine and Rehabilitation | Admitting: Physical Medicine and Rehabilitation

## 2021-02-20 ENCOUNTER — Encounter (HOSPITAL_COMMUNITY): Payer: Self-pay

## 2021-02-20 ENCOUNTER — Other Ambulatory Visit: Payer: Self-pay

## 2021-02-20 DIAGNOSIS — Z6832 Body mass index (BMI) 32.0-32.9, adult: Secondary | ICD-10-CM | POA: Diagnosis not present

## 2021-02-20 DIAGNOSIS — S270XXD Traumatic pneumothorax, subsequent encounter: Secondary | ICD-10-CM

## 2021-02-20 DIAGNOSIS — S52502D Unspecified fracture of the lower end of left radius, subsequent encounter for closed fracture with routine healing: Secondary | ICD-10-CM

## 2021-02-20 DIAGNOSIS — S52612D Displaced fracture of left ulna styloid process, subsequent encounter for closed fracture with routine healing: Secondary | ICD-10-CM

## 2021-02-20 DIAGNOSIS — S52611D Displaced fracture of right ulna styloid process, subsequent encounter for closed fracture with routine healing: Secondary | ICD-10-CM

## 2021-02-20 DIAGNOSIS — S2242XD Multiple fractures of ribs, left side, subsequent encounter for fracture with routine healing: Secondary | ICD-10-CM

## 2021-02-20 DIAGNOSIS — K602 Anal fissure, unspecified: Secondary | ICD-10-CM | POA: Diagnosis present

## 2021-02-20 DIAGNOSIS — Z79899 Other long term (current) drug therapy: Secondary | ICD-10-CM

## 2021-02-20 DIAGNOSIS — S2242XA Multiple fractures of ribs, left side, initial encounter for closed fracture: Secondary | ICD-10-CM | POA: Diagnosis present

## 2021-02-20 DIAGNOSIS — K5900 Constipation, unspecified: Secondary | ICD-10-CM | POA: Diagnosis present

## 2021-02-20 DIAGNOSIS — E669 Obesity, unspecified: Secondary | ICD-10-CM | POA: Diagnosis present

## 2021-02-20 DIAGNOSIS — S32402A Unspecified fracture of left acetabulum, initial encounter for closed fracture: Secondary | ICD-10-CM | POA: Diagnosis present

## 2021-02-20 DIAGNOSIS — D62 Acute posthemorrhagic anemia: Secondary | ICD-10-CM | POA: Diagnosis present

## 2021-02-20 DIAGNOSIS — T148XXA Other injury of unspecified body region, initial encounter: Secondary | ICD-10-CM

## 2021-02-20 DIAGNOSIS — B373 Candidiasis of vulva and vagina: Secondary | ICD-10-CM | POA: Diagnosis not present

## 2021-02-20 DIAGNOSIS — Z713 Dietary counseling and surveillance: Secondary | ICD-10-CM | POA: Diagnosis not present

## 2021-02-20 DIAGNOSIS — S32401D Unspecified fracture of right acetabulum, subsequent encounter for fracture with routine healing: Secondary | ICD-10-CM

## 2021-02-20 DIAGNOSIS — S82871D Displaced pilon fracture of right tibia, subsequent encounter for closed fracture with routine healing: Principal | ICD-10-CM

## 2021-02-20 DIAGNOSIS — N6012 Diffuse cystic mastopathy of left breast: Secondary | ICD-10-CM | POA: Diagnosis present

## 2021-02-20 DIAGNOSIS — N6320 Unspecified lump in the left breast, unspecified quadrant: Secondary | ICD-10-CM

## 2021-02-20 DIAGNOSIS — S32462D Displaced associated transverse-posterior fracture of left acetabulum, subsequent encounter for fracture with routine healing: Secondary | ICD-10-CM

## 2021-02-20 DIAGNOSIS — B009 Herpesviral infection, unspecified: Secondary | ICD-10-CM | POA: Diagnosis present

## 2021-02-20 DIAGNOSIS — R52 Pain, unspecified: Secondary | ICD-10-CM

## 2021-02-20 DIAGNOSIS — M25571 Pain in right ankle and joints of right foot: Secondary | ICD-10-CM

## 2021-02-20 MED ORDER — BISACODYL 10 MG RE SUPP: 10.0000 mg | Freq: Every day | RECTAL | Status: DC | PRN

## 2021-02-20 MED ORDER — DOCUSATE SODIUM 100 MG PO CAPS
100.0000 mg | ORAL_CAPSULE | Freq: Two times a day (BID) | ORAL | Status: DC
  Administered 2021-02-20 – 2021-03-08 (×20): 100 mg via ORAL
  Filled 2021-02-20 (×33): qty 1

## 2021-02-20 MED ORDER — ACETAMINOPHEN 325 MG PO TABS
325.0000 mg | ORAL_TABLET | ORAL | Status: DC | PRN
  Filled 2021-02-20 (×2): qty 2

## 2021-02-20 MED ORDER — POLYETHYLENE GLYCOL 3350 17 G PO PACK
17.0000 g | PACK | Freq: Two times a day (BID) | ORAL | Status: DC
  Administered 2021-02-20 – 2021-03-08 (×18): 17 g via ORAL
  Filled 2021-02-20 (×32): qty 1

## 2021-02-20 MED ORDER — SORBITOL 70 % SOLN
960.0000 mL | TOPICAL_OIL | Freq: Once | ORAL | Status: AC
  Administered 2021-02-20: 960 mL via RECTAL
  Filled 2021-02-20: qty 473

## 2021-02-20 MED ORDER — WHITE PETROLATUM EX OINT
TOPICAL_OINTMENT | CUTANEOUS | Status: AC
  Administered 2021-02-20: 0.2
  Filled 2021-02-20: qty 28.35

## 2021-02-20 MED ORDER — VITAMIN D 25 MCG (1000 UNIT) PO TABS
2000.0000 [IU] | ORAL_TABLET | Freq: Every day | ORAL | Status: DC
  Administered 2021-02-21 – 2021-03-09 (×17): 2000 [IU] via ORAL
  Filled 2021-02-20 (×17): qty 2

## 2021-02-20 MED ORDER — LIDOCAINE 5 % EX PTCH
1.0000 | MEDICATED_PATCH | CUTANEOUS | Status: DC
  Administered 2021-02-21 – 2021-03-08 (×11): 1 via TRANSDERMAL
  Filled 2021-02-20 (×14): qty 1

## 2021-02-20 MED ORDER — VALACYCLOVIR HCL 500 MG PO TABS
500.0000 mg | ORAL_TABLET | Freq: Every day | ORAL | Status: DC
  Administered 2021-02-20 – 2021-02-26 (×6): 500 mg via ORAL
  Filled 2021-02-20 (×8): qty 1

## 2021-02-20 MED ORDER — ENOXAPARIN SODIUM 30 MG/0.3ML IJ SOSY
30.0000 mg | PREFILLED_SYRINGE | Freq: Two times a day (BID) | INTRAMUSCULAR | Status: DC
  Administered 2021-02-20 – 2021-03-09 (×34): 30 mg via SUBCUTANEOUS
  Filled 2021-02-20 (×36): qty 0.3

## 2021-02-20 MED ORDER — ENOXAPARIN SODIUM 30 MG/0.3ML IJ SOSY: 30.0000 mg | PREFILLED_SYRINGE | Freq: Two times a day (BID) | INTRAMUSCULAR | Status: DC

## 2021-02-20 MED ORDER — METHOCARBAMOL 500 MG PO TABS
750.0000 mg | ORAL_TABLET | Freq: Three times a day (TID) | ORAL | Status: DC
  Administered 2021-02-20 – 2021-03-09 (×50): 750 mg via ORAL
  Filled 2021-02-20 (×7): qty 1
  Filled 2021-02-20 (×2): qty 2
  Filled 2021-02-20: qty 1
  Filled 2021-02-20 (×3): qty 2
  Filled 2021-02-20 (×8): qty 1
  Filled 2021-02-20 (×2): qty 2
  Filled 2021-02-20 (×8): qty 1
  Filled 2021-02-20: qty 2
  Filled 2021-02-20 (×2): qty 1
  Filled 2021-02-20 (×3): qty 2
  Filled 2021-02-20: qty 1
  Filled 2021-02-20: qty 2
  Filled 2021-02-20 (×8): qty 1
  Filled 2021-02-20 (×3): qty 2
  Filled 2021-02-20: qty 1

## 2021-02-20 MED ORDER — OXYCODONE HCL 5 MG PO TABS
5.0000 mg | ORAL_TABLET | ORAL | Status: DC | PRN
  Administered 2021-02-21 (×2): 10 mg via ORAL
  Administered 2021-02-21: 5 mg via ORAL
  Administered 2021-02-22 – 2021-02-24 (×5): 10 mg via ORAL
  Administered 2021-02-24: 5 mg via ORAL
  Administered 2021-02-25 – 2021-03-08 (×20): 10 mg via ORAL
  Filled 2021-02-20: qty 2
  Filled 2021-02-20: qty 1
  Filled 2021-02-20 (×24): qty 2
  Filled 2021-02-20: qty 1
  Filled 2021-02-20 (×3): qty 2
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
## 2021-02-20 NOTE — Plan of Care (Signed)
D/c to CIR report given to [facility] RN on unit will d/c to 4West room 6.

## 2021-02-20 NOTE — Progress Notes (Signed)
Patient transferred from 6N via bed to 4W. Patient stated that she understood all safety and admission rules and protocols. Cletis Media, LPN
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
## 2021-02-21 ENCOUNTER — Inpatient Hospital Stay (HOSPITAL_COMMUNITY): Payer: Self-pay

## 2021-02-21 DIAGNOSIS — M7989 Other specified soft tissue disorders: Secondary | ICD-10-CM

## 2021-02-21 LAB — COMPREHENSIVE METABOLIC PANEL
ALT: 33 U/L (ref 0–44)
AST: 24 U/L (ref 15–41)
Albumin: 2.8 g/dL — ABNORMAL LOW (ref 3.5–5.0)
Alkaline Phosphatase: 55 U/L (ref 38–126)
Anion gap: 7 (ref 5–15)
BUN: 8 mg/dL (ref 6–20)
CO2: 26 mmol/L (ref 22–32)
Calcium: 9.2 mg/dL (ref 8.9–10.3)
Chloride: 102 mmol/L (ref 98–111)
Creatinine, Ser: 0.65 mg/dL (ref 0.44–1.00)
GFR, Estimated: >60 mL/min (ref >60)
Glucose, Bld: 117 mg/dL — ABNORMAL HIGH (ref 70–99)
Potassium: 4 mmol/L (ref 3.5–5.1)
Sodium: 135 mmol/L (ref 135–145)
Total Bilirubin: 0.8 mg/dL (ref 0.3–1.2)
Total Protein: 6.6 g/dL (ref 6.5–8.1)

## 2021-02-21 LAB — CBC WITH DIFFERENTIAL/PLATELET
Abs Immature Granulocytes: 0.21 10*3/uL — ABNORMAL HIGH (ref 0.00–0.07)
Basophils Absolute: 0.1 10*3/uL (ref 0.0–0.1)
Basophils Relative: 1 %
Eosinophils Absolute: 0.1 10*3/uL (ref 0.0–0.5)
Eosinophils Relative: 2 %
HCT: 26.5 % — ABNORMAL LOW (ref 36.0–46.0)
Hemoglobin: 8 g/dL — ABNORMAL LOW (ref 12.0–15.0)
Immature Granulocytes: 3 %
Lymphocytes Relative: 27 %
Lymphs Abs: 2.1 10*3/uL (ref 0.7–4.0)
MCH: 20.8 pg — ABNORMAL LOW (ref 26.0–34.0)
MCHC: 30.2 g/dL (ref 30.0–36.0)
MCV: 68.8 fL — ABNORMAL LOW (ref 80.0–100.0)
Monocytes Absolute: 0.9 10*3/uL (ref 0.1–1.0)
Monocytes Relative: 12 %
Neutro Abs: 4.4 10*3/uL (ref 1.7–7.7)
Neutrophils Relative %: 55 %
Platelets: 240 10*3/uL (ref 150–400)
RBC: 3.85 MIL/uL — ABNORMAL LOW (ref 3.87–5.11)
RDW: 17.2 % — ABNORMAL HIGH (ref 11.5–15.5)
WBC: 7.8 10*3/uL (ref 4.0–10.5)
nRBC: 0.3 % — ABNORMAL HIGH (ref 0.0–0.2)

## 2021-02-21 NOTE — Progress Notes (Signed)
Occupational Therapy Session Note  Patient Details  Name: Jennifer Logan MRN: [id number] Date of Birth: Oct 04, 1990  Today's Date: 02/21/2021 OT Individual Time: 3976-7341 OT Individual Time Calculation (min): 15 min  and Today's Date: 02/21/2021 OT Missed Time: 30 Minutes Missed Time Reason: Other (comment) (vascular in room for scan)   Short Term Goals: Week 1:  OT Short Term Goal 1 (Week 1): Pt will be able to complete toilet transfer with mod A of 1. OT Short Term Goal 2 (Week 1): Pt will be able to complete toileting with max A of 1. OT Short Term Goal 3 (Week 1): Pt will be able to don bra and shirt with set up. OT Short Term Goal 4 (Week 1): Pt will be able to sit to EOB with CGA.  Skilled Therapeutic Interventions/Progress Updates:  Pt resting in bed upon arrival and agreeable to getting OOB. OT intervention with focus on bed mobility, sit<>stand, stand pivot transfer with PFRW, discharge planning, and safety awarness. Pt able to verbalize and adhere to [facility] precautions. Sit<>stand from EOB with elevated surface with mod A +2 for safety. Stand pivot transfer to recliner with min A and extra time. Min verbal cues for LLE placement when sitting in recliner. Stand>sit in recliner with min A and min verbal cues for sequencing. Discussed LTGs and DME recommendations/needs. Pt remained seated in recliner with seat alarm activated and all needs within reach. Pt missed 30 mins skilled OT services.  Therapy Documentation Precautions:  Precautions Precautions: Fall Required Braces or Orthoses: Other Brace Other Brace: R camboot Restrictions Weight Bearing Restrictions: Yes LUE Weight Bearing: Weight bear through elbow only RLE Weight Bearing: Weight bearing as tolerated (for transfers only) LLE Weight Bearing: Touchdown weight bearing General: General OT Amount of Missed Time: 30 Minutes Pain: Pt reports that she is "ok"; discomfort in LLE calf area  Therapy/Group: Individual  Therapy  Rich Brave 02/21/2021, 2:32 PM

## 2021-02-21 NOTE — Progress Notes (Signed)
Inpatient Rehabilitation Center Individual Statement of Services  Patient Name:  Jennifer Logan  Date:  02/21/2021  Welcome to the Inpatient Rehabilitation Center.  Our goal is to provide you with an individualized program based on your diagnosis and situation, designed to meet your specific needs.  With this comprehensive rehabilitation program, you will be expected to participate in at least 3 hours of rehabilitation therapies Monday-Friday, with modified therapy programming on the weekends.  Your rehabilitation program will include the following services:  Physical Therapy (PT), Occupational Therapy (OT), 24 hour per day rehabilitation nursing, Therapeutic Recreaction (TR), Care Coordinator, Rehabilitation Medicine, Nutrition Services, and Pharmacy Services  Weekly team conferences will be held on Tuesday to discuss your progress.  Your Inpatient Rehabilitation Care Coordinator will talk with you frequently to get your input and to update you on team discussions.  Team conferences with you and your family in attendance may also be held.  Expected length of stay: 12-14 days  Overall anticipated outcome: CGA-min level  Depending on your progress and recovery, your program may change. Your Inpatient Rehabilitation Care Coordinator will coordinate services and will keep you informed of any changes. Your Inpatient Rehabilitation Care Coordinator's name and contact numbers are listed  below.  The following services may also be recommended but are not provided by the Inpatient Rehabilitation Center:  Driving Evaluations Home Health Rehabiltiation Services Outpatient Rehabilitation Services Vocational Rehabilitation   Arrangements will be made to provide these services after discharge if needed.  Arrangements include referral to agencies that provide these services.  Your insurance has been verified to be:  None Your primary doctor is:  None  Pertinent information will be shared with your  doctor and your insurance company.  Inpatient Rehabilitation Care Coordinator:  Susie Cassette [phone] or ([phone]  Information discussed with and copy given to patient by: Lucy Chris, 02/21/2021, 1:43 PM
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
## 2021-02-21 NOTE — Discharge Instructions (Addendum)
Inpatient Rehab Discharge Instructions  Jennifer Logan Discharge date and time: No discharge date for patient encounter.   Activities/Precautions/ Functional Status: Activity:  Weightbearing as tolerated through left elbow, touchdown weightbearing left lower extremity, weightbearing as tolerated transfers only right lower extremity Diet: regular diet Wound Care: Routine skin checks Functional status:  ___ No restrictions     ___ Walk up steps independently ___ 24/7 supervision/assistance   ___ Walk up steps with assistance ___ Intermittent supervision/assistance  ___ Bathe/dress independently ___ Walk with walker     _x__ Bathe/dress with assistance ___ Walk Independently    ___ Shower independently ___ Walk with assistance    ___ Shower with assistance ___ No alcohol     ___ Return to work/school ________  COMMUNITY REFERRALS UPON DISCHARGE:    Please refer to home exercise program provided by therapy team for continued exercises to work on post discharge.  Medical Equipment/Items Ordered:bilateral platform attachments and elevating leg rests                                                 Agency/Supplier:[employer] [phone] (charity)  GENERAL COMMUNITY RESOURCES FOR PATIENT/FAMILY: New patient appointment/Hospital follow-up with [facility] location [phone] for Tuesday, August 2 at 11am. Be sure to arrive at 10:45am for this appointment. Must bring in proof of income for herself or individuals who reside in the home, and/or notarized letter stating current situation.  Special Instructions: No driving smoking or alcohol   My questions have been answered and I understand these instructions. I will adhere to these goals and the provided educational materials after my discharge from the hospital.  Patient/Caregiver Signature _______________________________ Date __________  Clinician Signature _______________________________________ Date  __________  Please bring this form and your medication list with you to all your follow-up doctor's appointments.

## 2021-02-21 NOTE — Progress Notes (Signed)
Physical Therapy Session Note  Patient Details  Name: Jennifer Logan MRN: [id number] Date of Birth: June 05, 1991  Today's Date: 02/21/2021 PT Individual Time: 1415-1500 PT Individual Time Calculation (min): 45 min   Short Term Goals: Week 1:  PT Short Term Goal 1 (Week 1): Pt will perform least restrictive transfer with assist x 1 PT Short Term Goal 2 (Week 1): Pt will perform bed mobility with min A PT Short Term Goal 3 (Week 1): Pt wil initiate car transfer  Skilled Therapeutic Interventions/Progress Updates:    Pt received seated in recliner in room, agreeable to PT session. Pt reports ongoing pain and tenderness in L calf, elevating LE while in seated position and pt received pain medication prior to start of therapy session. Attempt sit to stand to PFRW from recliner this date, pt unable to stand with assist x 1. Remainder of session focused on BLE strengthening therex: heel slides, hip abd, SAQ x 10 reps each. Pt requires AAROM for LLE hip abd and heel slides, use of gait belt around heel for increased independence with limb mobility. Pt initially exhibits heavy muscle guarding in L knee preventing full knee flexion ROM, exhibits improved ROM as exercises progress. Pt left seated in recliner in room with needs in reach at end of session.  Therapy Documentation Precautions:  Precautions Precautions: Fall Required Braces or Orthoses: Other Brace Other Brace: R camboot Restrictions Weight Bearing Restrictions: Yes LUE Weight Bearing: Weight bear through elbow only RLE Weight Bearing: Weight bearing as tolerated (for transfers only) LLE Weight Bearing: Touchdown weight bearing    Therapy/Group: Individual Therapy   Peter Congo, PT, DPT, CSRS  02/21/2021, 3:40 PM

## 2021-02-21 NOTE — Progress Notes (Signed)
Patient ID: Jennifer Logan, female   DOB: 1991-06-24, 30 y.o.   MRN: [id number] Team Conference Report to Patient/Family  Team Conference discussion was reviewed with the patient and caregiver, including goals, any changes in plan of care and target discharge date.  Patient and caregiver express understanding and are in agreement.  The patient has a target discharge date of  (18-21 days).  Covering for primary SW, Auria.  Provided pt with conference updates. Pt d/c to be determined. SW will cont to follow up.  Andria Rhein 02/21/2021, 1:06 PM
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
## 2021-02-21 NOTE — Progress Notes (Signed)
Inpatient Rehabilitation Medication Review by a Pharmacist  A complete drug regimen review was completed for this patient to identify any potential clinically significant medication issues.  Clinically significant medication issues were identified:  no  Check AMION for pharmacist assigned to patient if future medication questions/issues arise during this admission.  Pharmacist comments:   Time spent performing this drug regimen review (minutes):  15   Dennie Moltz 02/21/2021 3:38 PM
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
## 2021-02-21 NOTE — Plan of Care (Signed)
  Problem: Sit to Stand Goal: LTG:  Patient will perform sit to stand with assistance level (PT) Description: LTG:  Patient will perform sit to stand with assistance level (PT) Flowsheets (Taken 02/21/2021 1151) LTG: PT will perform sit to stand in preparation for functional mobility with assistance level: Minimal Assistance - Patient > 75%   Problem: RH Bed Mobility Goal: LTG Patient will perform bed mobility with assist (PT) Description: LTG: Patient will perform bed mobility with assistance, with/without cues (PT). Flowsheets (Taken 02/21/2021 1151) LTG: Pt will perform bed mobility with assistance level of: Independent with assistive device    Problem: RH Bed to Chair Transfers Goal: LTG Patient will perform bed/chair transfers w/assist (PT) Description: LTG: Patient will perform bed to chair transfers with assistance (PT). Flowsheets (Taken 02/21/2021 1151) LTG: Pt will perform Bed to Chair Transfers with assistance level: Minimal Assistance - Patient > 75%   Problem: RH Car Transfers Goal: LTG Patient will perform car transfers with assist (PT) Description: LTG: Patient will perform car transfers with assistance (PT). Flowsheets (Taken 02/21/2021 1151) LTG: Pt will perform car transfers with assist:: Minimal Assistance - Patient > 75%

## 2021-02-21 NOTE — Progress Notes (Signed)
Bilateral lower extremity venous duplex has been completed. Preliminary results can be found in CV Proc through chart review.   02/21/21 2:03 PM Olen Cordial RVT

## 2021-02-21 NOTE — Progress Notes (Signed)
Inpatient Rehabilitation  Patient information reviewed and entered into eRehab system by Airyanna Dipalma M. Rahul Malinak, M.A., CCC/SLP, PPS Coordinator.  Information including medical coding, functional ability and quality indicators will be reviewed and updated through discharge.    

## 2021-02-22 NOTE — Progress Notes (Signed)
Occupational Therapy Session Note  Patient Details  Name: Jennifer Logan MRN: [id number] Date of Birth: 08/19/1991  Today's Date: 02/22/2021 OT Individual Time: 1130-1155 OT Individual Time Calculation (min): 25 min    Short Term Goals: Week 1:  OT Short Term Goal 1 (Week 1): Pt will be able to complete toilet transfer with mod A of 1. OT Short Term Goal 2 (Week 1): Pt will be able to complete toileting with max A of 1. OT Short Term Goal 3 (Week 1): Pt will be able to don bra and shirt with set up. OT Short Term Goal 4 (Week 1): Pt will be able to sit to EOB with CGA.  Skilled Therapeutic Interventions/Progress Updates:    Pt sitting EOB upon arrival. Pt with ice pack on Lt upper back. Pt agreeable to transferring to recliner. Bed mobility with max A to bring BLE onto bed and turn to exit bed from side closest to recliner. Sit<>stand and stand pivot transfer with max A+2 and max verbal cues for sequencing and maintaining WBing precautions. Pt reported some nausea at end of transfer. Cold wash cloth provided and emesis bag. Fan provided. Pt stated she will be going to a different Aunt's home with one floor, no steps, and extra bedroom with bed. Pt will send home measurement sheet to Aunt. Pt remained in relciner with all needs within reach.   Therapy Documentation Precautions:  Precautions Precautions: Fall Required Braces or Orthoses: Other Brace Other Brace: R camboot Restrictions Weight Bearing Restrictions: Yes LUE Weight Bearing: Weight bear through elbow only RLE Weight Bearing: Weight bearing as tolerated LLE Weight Bearing: Touchdown weight bearing   Pain: Pain Assessment Pain Scale: 0-10 Pain Score: 5  Pain Type: Acute pain Pain Location: Rib cage, and Lt upper back/scapula Pain Orientation: Left Pain Descriptors / Indicators: Aching Pain Intervention(s): Ice pack applied   Therapy/Group: Individual Therapy  Rich Brave 02/22/2021, 12:11 PM

## 2021-02-22 NOTE — Progress Notes (Signed)
Occupational Therapy Session Note  Patient Details  Name: Jennifer Logan MRN: [id number] Date of Birth: September 19, 1990  Today's Date: 02/22/2021 OT Individual Time: 0930-1040 OT Individual Time Calculation (min): 70 min    Short Term Goals: Week 1:  OT Short Term Goal 1 (Week 1): Pt will be able to complete toilet transfer with mod A of 1. OT Short Term Goal 2 (Week 1): Pt will be able to complete toileting with max A of 1. OT Short Term Goal 3 (Week 1): Pt will be able to don bra and shirt with set up. OT Short Term Goal 4 (Week 1): Pt will be able to sit to EOB with CGA.  Skilled Therapeutic Interventions/Progress Updates:    Pt sitting EOB upon arrival. OT intervention with focus on bathing/dressing with sit<>stand from EOB. UB bathing/dressing with min A. Pt required max A for doffing pants using lateral leans. Pt able to bathe front perineal area with BLE elevated and upper body in supine with support. Discussed more efficient arrangement and options going forward, including bed level for LB and/or sitting on BSC to allow better access to perineal area. Pt stood with max A+2 for pulling pants over hips. Discussed discharge plans. Initial discharge to Aunt with couch to sleep on and half bathroom. Pt stated she could stay with Aunt is has extra bedroom and full bath with no steps to enter. Pt remained seated EOB with all needs within reach. Ice applied to Lt calf  and L anterior ribs. Bed alarm activated.   Therapy Documentation Precautions:  Precautions Precautions: Fall Required Braces or Orthoses: Other Brace Other Brace: R camboot Restrictions Weight Bearing Restrictions: Yes LUE Weight Bearing: Weight bear through elbow only RLE Weight Bearing: Weight bearing as tolerated LLE Weight Bearing: Touchdown weight bearing   Pain: Pain Assessment Pain Scale: 0-10 Pain Score: 9  Pain Type: Acute pain Pain Location: Rib cage Pain Orientation: Left Pain Descriptors / Indicators:  Aching Pain Intervention(s): Medications admin prior to therapy and ice applied     Therapy/Group: Individual Therapy  Jennifer Logan 02/22/2021, 10:45 AM

## 2021-02-22 NOTE — Progress Notes (Signed)
Physical Therapy Session Note  Patient Details  Name: Jennifer Logan MRN: [id number] Date of Birth: 1991-01-18  Today's Date: 02/22/2021 PT Individual Time: 0800-0850; 1300-1400 PT Individual Time Calculation (min): 50 min and 60 min PT Missed Time: 15 min Missed Time Reason: patient fatigue; pain  Short Term Goals: Week 1:  PT Short Term Goal 1 (Week 1): Pt will perform least restrictive transfer with assist x 1 PT Short Term Goal 2 (Week 1): Pt will perform bed mobility with min A PT Short Term Goal 3 (Week 1): Pt wil initiate car transfer  Skilled Therapeutic Interventions/Progress Updates:    Session 1: Pt received seated in bed, agreeable to PT session. Pt reports 9/10 pain in L upper chest/rib area and in L calf this date, nursing able to provide pain medication during session. Also provided ice packs to L calf and L chest at end of session. Pt also reports pain in R wrist with WBing, ACE wrapped for comfort. Seated in bed to sitting EOB with min A needed for LLE management. Sit to stand x 2 reps to L PFRW from elevated bed with max to total A x 1. Adjusted height of RW for patient for improved fit. Seated BLE strengthneing therex: marches, LAQ, hip add squeeze x 10 reps each. Pt left seated EOB with needs in reach at end of session.  Session 2: Pt received seated in recliner in room, agreeable to PT session. Pt with minimal complaints of pain at rest, has onset of L upper chest, L hip/pelvis, and L calf pain with mobility. Utilized ice packs at end of session for pain management. Attempt sit to stand from recliner to PFRW, pt unable to with assist x 1 due to low seat height. Sit to stand from recliner to L PFRW with mod A x 2. Stand pivot transfer to w/c with assist x 2 for safety, balance, and RW management. Pt with poor adherence to TDWBing on LLE with transfer. Dependent transport via w/c to/from therapy gym due to [facility] restrictions. Seated BLE strengthening therex: LAQ, marches with  AAROM needed for L hip ROM x 10 reps each. Pt requests to return to bed at end of session. Sit to stand max to total A x 2 due to pain. Stand pivot transfer back to bed with assist x 2 for safety and equipment management. Sit to supine min A for LLE management. Pt becomes tearful following transfer back to bed due to ongoing with difficulty with transfers. Provided emotional support. Pt left seated in bed with needs in reach, bed alarm in place, ice pack to L chest and L pelvic region. Pt missed 15 min of scheduled therapy session due to pain and fatigue.  Therapy Documentation Precautions:  Precautions Precautions: Fall Required Braces or Orthoses: Other Brace Other Brace: R camboot Restrictions Weight Bearing Restrictions: Yes LUE Weight Bearing: Weight bear through elbow only RLE Weight Bearing: Weight bearing as tolerated LLE Weight Bearing: Touchdown weight bearing     Therapy/Group: Individual Therapy  Peter Congo, PT, DPT, CSRS  02/22/2021, 12:04 PM

## 2021-02-23 ENCOUNTER — Inpatient Hospital Stay (HOSPITAL_COMMUNITY): Payer: Self-pay

## 2021-02-23 DIAGNOSIS — S32402D Unspecified fracture of left acetabulum, subsequent encounter for fracture with routine healing: Secondary | ICD-10-CM

## 2021-02-23 NOTE — Progress Notes (Signed)
Occupational Therapy Session Note  Patient Details  Name: Jennifer Logan MRN: [id number] Date of Birth: [birth year]  Today's Date: 02/23/2021 OT Individual Time: 1500-1530 OT Individual Time Calculation (min): 30 min    Short Term Goals: Week 1:  OT Short Term Goal 1 (Week 1): Pt will be able to complete toilet transfer with mod A of 1. OT Short Term Goal 2 (Week 1): Pt will be able to complete toileting with max A of 1. OT Short Term Goal 3 (Week 1): Pt will be able to don bra and shirt with set up. OT Short Term Goal 4 (Week 1): Pt will be able to sit to EOB with CGA.   Skilled Therapeutic Interventions/Progress Updates:    Pt reports she just got back in bed, and had xray to right wrist this AM to rule out injury.  OT consulted with PA and informed of newly identified right minimally displaced ulnar styloid process fracture.  Per PA, no weightbearing and light use only of RUE and to ace wrap wrist.  OT wrapped pts right wrist with ace bandage, elevated using pillow, and applied CP to reduce pain.  Nursing made aware of new RUE precautions.  Call bell in reach, bed alarm on.  Therapy Documentation Precautions:  Precautions Precautions: Fall Required Braces or Orthoses: Other Brace Other Brace: R camboot Restrictions Weight Bearing Restrictions: Yes LUE Weight Bearing: Weight bear through elbow only RLE Weight Bearing: Weight bearing as tolerated LLE Weight Bearing: Touchdown weight bearing    Therapy/Group: Individual Therapy  Amie Critchley 02/23/2021, 6:30 PM

## 2021-02-23 NOTE — Progress Notes (Signed)
Physical Therapy Session Note  Patient Details  Name: Jennifer Logan MRN: [id number] Date of Birth: 10-15-1990  Today's Date: 02/23/2021 PT Individual Time: 1130-1205 PT Individual Time Calculation (min): 35 min   Short Term Goals: Week 1:  PT Short Term Goal 1 (Week 1): Pt will perform least restrictive transfer with assist x 1 PT Short Term Goal 2 (Week 1): Pt will perform bed mobility with min A PT Short Term Goal 3 (Week 1): Pt wil initiate car transfer Week 2:     Skilled Therapeutic Interventions/Progress Updates:    Pt initially supine and requesting assist oob to wc.  Therapist discussed pending xray, decided on sliding board as conservative option.  Cam boot donned by therapist, pt lifts foot for this. Supine to sit on edge of bed w/min assist adhereing to wbing precautions. Therapist donned grip sock.  X ray arrived for wrist xray.  Inquired re ankle and contacted Bishop Limbo, PA to clarify for tech.  Pt returned to supine for xrays.  missed time for xray, session extended 5 min at end to make up time Supine to sit w/min assist.   Sliding board transfer bed to wc NWBing x 4exts w/ total assist due to pending xrays/out of abundance of caution. Pt transported to gym. Performed bilat LE arom including: LAQx x20 Hip IR/ER RLE x 15 in tolerable range Hip flexion RLE x 10 limited range Hip adduction x 10 limited range. Glut sets x 10 At end of session, pt trnasported to room.  Pt left oob in wc w/alarm belt set and needs in reach   Therapy Documentation Precautions:  Precautions Precautions: Fall Required Braces or Orthoses: Other Brace Other Brace: R camboot Restrictions Weight Bearing Restrictions: Yes LUE Weight Bearing: Weight bear through elbow only RLE Weight Bearing: Weight bearing as tolerated LLE Weight Bearing: Touchdown weight bearing   Therapy/Group: Individual Therapy Rada Hay, PT   Shearon Balo 02/23/2021, 12:20 PM

## 2021-02-23 NOTE — Progress Notes (Signed)
Occupational Therapy Session Note  Patient Details  Name: Jennifer Logan MRN: [id number] Date of Birth: Jul 21, 1991  Today's Date: 02/23/2021 OT Individual Time: 0845-1000 OT Individual Time Calculation (min): 75 min    Short Term Goals: Week 1:  OT Short Term Goal 1 (Week 1): Pt will be able to complete toilet transfer with mod A of 1. OT Short Term Goal 2 (Week 1): Pt will be able to complete toileting with max A of 1. OT Short Term Goal 3 (Week 1): Pt will be able to don bra and shirt with set up. OT Short Term Goal 4 (Week 1): Pt will be able to sit to EOB with CGA.  Skilled Therapeutic Interventions/Progress Updates:  Pt received supine in bed with MD and PA present as pt reporting increased pain in R wrist ( ulnar styloid area especially with wrist flexion) and increased pain in R ankle, however pt was agreeable to OT intervention. Deferred OOB mobility d/t pending f/u from ortho/ x-rays, session conducted session from bed level with pt able to complete bathing with overall MOD A needing most assist for lower legs- discussed use of LH sponge for LB bathing during next OT ADL session. Pt required set- up assist for oral care from bed level, MIN A for UB dressing, and MAX A for LB dressing. Education provided on dressing affected upper extremity first, pt required MAX A to thread BLEs into pants however pt able to assist with pulling pants up to waist line by rolling R<>L. Pt additionally noted to report pain in perineal area during bathing; alerted RN and provided pt with inspection mirror, no redness noted in perineal area but pt continues to report burning, pt requested cold wash cloth to be applied to perineal area. Pt left upright in bed, with bed alarm set and all needs within reach.   Therapy Documentation Precautions:  Precautions Precautions: Fall Required Braces or Orthoses: Other Brace Other Brace: R camboot Restrictions Weight Bearing Restrictions: Yes LUE Weight Bearing:  Weight bear through elbow only RLE Weight Bearing: Weight bearing as tolerated LLE Weight Bearing: Touchdown weight bearing    Therapy/Group: Individual Therapy  [facility] 02/23/2021, 10:24 AM
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
## 2021-02-23 NOTE — Progress Notes (Signed)
Occupational Therapy Session Note  Patient Details  Name: Jennifer Logan MRN: [id number] Date of Birth: May 08, 1991  Today's Date: 02/23/2021 OT Individual Time: 1345-1430 OT Individual Time Calculation (min): 45 min    Short Term Goals: Week 1:  OT Short Term Goal 1 (Week 1): Pt will be able to complete toilet transfer with mod A of 1. OT Short Term Goal 2 (Week 1): Pt will be able to complete toileting with max A of 1. OT Short Term Goal 3 (Week 1): Pt will be able to don bra and shirt with set up. OT Short Term Goal 4 (Week 1): Pt will be able to sit to EOB with CGA.  Skilled Therapeutic Interventions/Progress Updates:  Pt received seated in w/c reporting fatigue and moderate pain, provided rest breaks and repositioning techniques throughout session as pain management strategies with pt able to tolerate session. Pt completed slide board transfer from w/c> EOB with MAX A +2. Pt able to return to sidelying with MOD A +1. Pt reports need to void bladder needing MAX A for toileting using urinal from bed level. Education provided on LB AE for LB ADLs, demonstration provided on using reacher for LB dressing, LH sponge for LB bathing and sock aid to don socks. Pt verbalizes understanding of all LB AE. Pt left in upright position in bed with bed alarm set and all needs within reach.   Therapy Documentation Precautions:  Precautions Precautions: Fall Required Braces or Orthoses: Other Brace Other Brace: R camboot Restrictions Weight Bearing Restrictions: Yes LUE Weight Bearing: Weight bear through elbow only RLE Weight Bearing: Weight bearing as tolerated LLE Weight Bearing: Touchdown weight bearing    Therapy/Group: Individual Therapy  Barron Schmid 02/23/2021, 3:11 PM

## 2021-02-24 MED ORDER — SORBITOL 70 % SOLN
30.0000 mL | Freq: Every day | Status: DC | PRN
  Administered 2021-02-24 – 2021-02-25 (×2): 30 mL via ORAL
  Filled 2021-02-24 (×2): qty 30

## 2021-02-24 NOTE — Progress Notes (Signed)
Orthopedic Tech Progress Note Patient Details:  Jennifer Logan 11-29-1990 [id number]  Ortho Devices Type of Ortho Device: Velcro wrist splint Ortho Device/Splint Location: Right Wrist Ortho Device/Splint Interventions: Application   Post Interventions Patient Tolerated: Well  [name] Apgar 02/24/2021, 12:23 PM
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
## 2021-02-24 NOTE — Progress Notes (Signed)
Occupational Therapy Session Note  Patient Details  Name: Jennifer Logan MRN: [id number] Date of Birth: 1990-10-24  Today's Date: 02/24/2021 OT Individual Time: 2585-2778 OT Individual Time Calculation (min): 55 min    Short Term Goals: Week 1:  OT Short Term Goal 1 (Week 1): Pt will be able to complete toilet transfer with mod A of 1. OT Short Term Goal 2 (Week 1): Pt will be able to complete toileting with max A of 1. OT Short Term Goal 3 (Week 1): Pt will be able to don bra and shirt with set up. OT Short Term Goal 4 (Week 1): Pt will be able to sit to EOB with CGA.  Skilled Therapeutic Interventions/Progress Updates:     Pt received semi-reclined in bed, no initial complaints of pain, agreeable to therapy. Session focus on  WB precautions edu, functional transfers, generalized conditioning in prep for improved ADL performance. Came to sitting EOB with max A to progress LLE off bed and to lift trunk. Light min A to maintain static balance 2/2 mild R LOB. Reviewed WB restrictions, pt ind recalled BUE restrictions, and req min A for clarification of BLE restrictions. SB transfer from bed > w/c with total A of 2, mod Vcs throughout to facilitate forward trunk flexion. Total A w/c transport to and from gym. Stand-pivot from w/c to mat with max A + 2 to power up and max Vcs for LLE TTWB restrictions. Max A for stand > sit, pt relying heavily on RUE to control descent despite cues to avoid WB through wrist. Completed 1x10 of the following for improved conditioning and generalized strengthening in prep for improved functional mobility/ ADL performance : modified sit-ups, chest press/shoulder press/chest openers with no weights. Stand-pivot back to w/c same manner as before, noted to partially WB through LLE. Relates difficulty pivoting on RLE and increased ankle pain post transfer. Reviewed LLE TTWB restrictions "do not crush the egg under your toes," but will benefit from further practice and  education.   Pt left in w/c with safety belt alarm engaged, call bell in reach, and all immediate needs met.   Therapy Documentation Precautions:  Precautions Precautions: Fall Required Braces or Orthoses: Other Brace Other Brace: R camboot Restrictions Weight Bearing Restrictions: Yes LUE Weight Bearing: Weight bear through elbow only RLE Weight Bearing: Weight bearing as tolerated LLE Weight Bearing: Touchdown weight bearing  Pain: see session note   ADL: See Care Tool for more details.    Therapy/Group: Individual Therapy  Volanda Napoleon MS, OTR/L  02/24/2021, 8:05 AM

## 2021-02-24 NOTE — Progress Notes (Signed)
Physical Therapy Session Note  Patient Details  Name: Jennifer Logan MRN: [id number] Date of Birth: [birth year]  Today's Date: 02/24/2021 PT Individual Time: 1305-1400 PT Individual Time Calculation (min): 55 min   Short Term Goals: Week 1:  PT Short Term Goal 1 (Week 1): Pt will perform least restrictive transfer with assist x 1 PT Short Term Goal 2 (Week 1): Pt will perform bed mobility with min A PT Short Term Goal 3 (Week 1): Pt wil initiate car transfer Week 2:     Skilled Therapeutic Interventions/Progress Updates:  Chart review this am reports pt now NWBing R wrist.   Pain:  Pt reports bottom, rib, wrist, R ankle pain.  Treatment to tolerance.  Rest breaks and repositioning as needed.  Pt declines offer to contact nursing for meds.  Pt initially oob in wc and agreeable to treatment session w/focus on transfer training.  Therapist dons R Cam Boot and R wrist splint.  Adjustments made to wrist splint for proper alignment.  Pt transported to gym.   stand pivot transfer to mat w/bilat platform walker w/max assist due to pt unable to maintain TDWB on LLE w/transfer. Reviewed note from Ortho PA w/patient and further explained current weightbearing restrictions to pt and importance of maintaining precautions to allow for healing, risk of further injury to acetabulum LLE if wbing retrictions not followed.  Discussed source Wbing restrictions and assured pt that source/Orthopedic Surgeon was most qualified individual to make decision.  Also clarified exactly what TDWBing allowed for.  Explained that she was certainly exceeding TDWB on LLE during transfer and educated that at this time, sliding board transfer was only safe option.  Pt reluctantly agreed but voiced understanding of importance of protecting healing fractures by adhering to wbing precautions.    Pt then instructed w/sliding board transfers, reviewed mechanics, head hips relationship.  Practiced wc to/from mat x 2 w/min assist,  additional time, limited clearance of hips, good adherence to wbing restricitons.  Seated overhead reach x 10  LAQs x 15  Transported to room Wc to bed via sliding board transfer w/min assist and set up, additional time. Sit to supine w/max assist for Les. Pt left supine w/rails up x 3, alarm set, bed in lowest position, and needs in reach.  Therapy Documentation Precautions:  Precautions Precautions: Fall Required Braces or Orthoses: Other Brace Other Brace: R camboot Restrictions Weight Bearing Restrictions: Yes LUE Weight Bearing: Weight bear through elbow only RLE Weight Bearing: Weight bearing as tolerated LLE Weight Bearing: Touchdown weight bearing    Therapy/Group: Individual Therapy Rada Hay, PT   Shearon Balo 02/24/2021, 3:37 PM

## 2021-02-24 NOTE — Progress Notes (Signed)
Occupational Therapy Session Note  Patient Details  Name: Jennifer Logan MRN: [id number] Date of Birth: August 01, 1991  Today's Date: 02/24/2021 OT Individual Time: 6834-1962 OT Individual Time Calculation (min): 55 min    Short Term Goals: Week 1:  OT Short Term Goal 1 (Week 1): Pt will be able to complete toilet transfer with mod A of 1. OT Short Term Goal 2 (Week 1): Pt will be able to complete toileting with max A of 1. OT Short Term Goal 3 (Week 1): Pt will be able to don bra and shirt with set up. OT Short Term Goal 4 (Week 1): Pt will be able to sit to EOB with CGA.  Skilled Therapeutic Interventions/Progress Updates:    Pt received in bed and had been cleansed, changed and dressed by nursing staff. The rehab tech took time to look for an additional platform for her R side as she is now NWB on R wrist.  By the time he was able to get platform attached, he had to leave so +2 A not available.  During this time, pt worked on a/arom of LEs using sheet under each leg to use as a sling to reduce weight on leg. On R leg, pt able to do abd of hip to 40 degrees and flex hip and knee to a functional range.  On L leg, she could only move to 20 abd and partial hip flex to 45, although in sitting she can sit at 90 flex,  She then worked on AROM of shoulders and elbows with various ROM exercise with arm circles, overhead reaches, tricep extension.    Pt tolerated exercises well.  She stated she has a lot of family support at home.   Pt in bed with all needs met and alarm set.   Therapy Documentation Precautions:  Precautions Precautions: Fall Required Braces or Orthoses: Other Brace Other Brace: R camboot Restrictions Weight Bearing Restrictions: Yes LUE Weight Bearing: Weight bear through elbow only RLE Weight Bearing: Weight bearing as tolerated LLE Weight Bearing: Touchdown weight bearing   Pain: Pain Assessment Pain Scale: 0-10 Pain Score: 6  Pain Location: Leg Pain Orientation:  Left Pain Intervention(s): Medication (See eMAR)   Therapy/Group: Individual Therapy  [place] 02/24/2021, 11:24 AM

## 2021-02-25 NOTE — Progress Notes (Signed)
Occupational Therapy Session Note  Patient Details  Name: Jennifer Logan MRN: [id number] Date of Birth: November 23, 1990  Today's Date: 02/25/2021 OT Individual Time: 1120-1204 OT Individual Time Calculation (min): 44 min   Short Term Goals: Week 1:  OT Short Term Goal 1 (Week 1): Pt will be able to complete toilet transfer with mod A of 1. OT Short Term Goal 2 (Week 1): Pt will be able to complete toileting with max A of 1. OT Short Term Goal 3 (Week 1): Pt will be able to don bra and shirt with set up. OT Short Term Goal 4 (Week 1): Pt will be able to sit to EOB with CGA.  Skilled Therapeutic Interventions/Progress Updates:    Pt greeted in bed, wearing her Rt wrist splint and per ortho note ok to bear weight through wrist with this splint donned. Her ADL needs were met, agreeable to practice simulated BSC transfers. Pt reported transferring to [facility] with nursing last night and having a very difficult time with it. She was able to accurately state all her weightbearing precautions at start of session. Min-mod cues for functional carryover during bed mobility and transfers. After RN arrived to place bandages on the Rt LE, CAM boot was donned. Min A for supine<sit and Min Ax2 for slideboard<BSC. Vcs for precaution adherence, anterior weight shifting, and head/hip relationship. She had an episode of teariness once sitting on BSC, stated she was having spasms of inner thigh muscles. Tried repositioning strategies to address with this ultimately being unsuccessful. After she returned to the bed, worked on scooting technique up towards [employer], [place]. The same assistance for returning to supine. OT retrieved MHP for muscle spasms per pt request. Notified RN of this and to assess skin in ~20 minutes for safety. RN verbalized understanding. Pt remained in bed at close of session, all needs within reach and bed alarm set.  Therapy Documentation Precautions:  Precautions Precautions: Fall Required Braces or  Orthoses: Other Brace Other Brace: R camboot Restrictions Weight Bearing Restrictions: Yes LUE Weight Bearing: Weight bear through elbow only RLE Weight Bearing: Touchdown weight bearing LLE Weight Bearing: Touchdown weight bearing  ADL: ADL Grooming: Setup Upper Body Bathing: Setup Where Assessed-Upper Body Bathing: Other (Comment) (BSC) Lower Body Bathing: Maximal assistance Where Assessed-Lower Body Bathing: Other (Comment) (BSC) Upper Body Dressing: Minimal assistance Lower Body Dressing: Dependent Toileting: Other (Comment), Maximal assistance (+2) Where Assessed-Toileting: Bedside Commode Toilet Transfer: Moderate assistance, Other (comment) (+2) Toilet Transfer Method: Stand pivot Toilet Transfer Equipment: Bedside commode    Therapy/Group: Individual Therapy  Victorhugo Preis A Indy [place] 02/25/2021, 12:53 PM
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
## 2021-02-26 NOTE — Progress Notes (Signed)
Physical Therapy Session Note  Patient Details  Name: Jennifer Logan MRN: [id number] Date of Birth: 1990/12/11  Today's Date: 02/26/2021 PT Individual Time: 0800-0900; 1300-1415 PT Individual Time Calculation (min): 60 min and 75 min  Short Term Goals: Week 1:  PT Short Term Goal 1 (Week 1): Pt will perform least restrictive transfer with assist x 1 PT Short Term Goal 2 (Week 1): Pt will perform bed mobility with min A PT Short Term Goal 3 (Week 1): Pt wil initiate car transfer  Skilled Therapeutic Interventions/Progress Updates:    Session 1: Pt received seated in bed, agreeable to PT session. Pt reports some pain in her back during session, not rated and declines intervention. Pt requesting to bathe and dress this PM. Pt is setup A for UB bathing with assist needed to wash her back. Pt is able to cleanse her periarea anteriorly, requires assist to wash her buttocks. Pt unable to tolerate rolling onto L side in bed in order to wash buttocks with RUE. Pt able to roll onto her R side in bed with min A for LLE management and then have buttocks dependently washed. Pt is mod A to don sports bra and shirt at bed level. Pt is max A to don pants with assist needed to thread LE and pull over buttocks. Assisted pt with donning R wrist brace and RLE CAM boot. Supine to sit with min A for LLE management with HOB elevated and use of bedrail. Attempt to have pt utilize gait belt around LLE to assist with mobility, unable to bring LE fully to EOB. Slide board transfer bed to w/c to the L with min A overall needed for scooting, assist from a 2nd person to stabilize slide board for safety. Pt given cues for head/hips relationship during transfer. Pt able to complete transfer with extra time. Pt left seated in w/c in room with needs in reach at end of session.  Session 2: Pt received seated in recliner in room, agreeable to PT session. No complaints of pain at rest, has onset of L hip pain and tightness with  mobility. Sit to stand with max A x 2 from recliner to PFRW. Stand pivot transfer to w/c with PFRW and min A, however pt unable to maintain TDWBing on LLE during transfer. Dependent transport via w/c to/from therapy gym due to [facility] restrictions. Slide board transfer w/c to/from mat table from R side of w/c with min A. Slide board transfer w/c to/from mat table from L side of w/c with up to mod A needed due to fatigue and onset of L hip pain/tightness. Discussed furniture in the home pt will d/c home to and ideally lowering height of bed if possible to make bed height more similar to height of w/c. Pt will discuss with her family. Attempt to perform car transfer, pt able to get halfway into car before onset of L hip/groin cramping and unable to complete transfer safely, returned to w/c. Attempt gentle L hip ROM, muscle guarding present and minimal change in pain. Pt reports urge to urinate. Slide board transfer w/c to/from [facility] with assist x 2. Pt is total A for clothing management, min A for pericare for thoroughness. Pt left seated in w/c in room with needs in reach, ice pack to L calf at end of session.  Therapy Documentation Precautions:  Precautions Precautions: Fall Required Braces or Orthoses: Other Brace Other Brace: R camboot Restrictions Weight Bearing Restrictions: Yes RUE Weight Bearing: Non weight bearing LUE Weight Bearing:  Non weight bearing (able to bear weight thru elbow) RLE Weight Bearing: Weight bearing as tolerated (with transfers only, non-ambulatory) LLE Weight Bearing: Touchdown weight bearing    Therapy/Group: Individual Therapy   Peter Congo, PT, DPT, CSRS  02/26/2021, 10:36 AM

## 2021-02-26 NOTE — Progress Notes (Signed)
Occupational Therapy Session Note  Patient Details  Name: Jennifer Logan MRN: [id number] Date of Birth: July 16, 1991  Today's Date: 02/26/2021 OT Individual Time: 1000-1100 OT Individual Time Calculation (min): 60 min    Short Term Goals: Week 1:  OT Short Term Goal 1 (Week 1): Pt will be able to complete toilet transfer with mod A of 1. OT Short Term Goal 2 (Week 1): Pt will be able to complete toileting with max A of 1. OT Short Term Goal 3 (Week 1): Pt will be able to don bra and shirt with set up. OT Short Term Goal 4 (Week 1): Pt will be able to sit to EOB with CGA.  Skilled Therapeutic Interventions/Progress Updates:    Pt c/o RUE pain on ulnar styloid when wearing splint. Foam provided with relief. Focus on discharge planning, sit<>stand, and slide board tranfsers. Pt's Aunt to bring home measurement sheet in later in day. Sit>stand X 2 with max A+2. Stand>sitX2 with min A. SB transfer to recliner with min A  and min verbal cues for technique/safety. Pt remained in recliner with all needs within reach. Seat alarm activated.  Therapy Documentation Precautions:  Precautions Precautions: Fall Required Braces or Orthoses: Other Brace Other Brace: R camboot Restrictions Weight Bearing Restrictions: Yes RUE Weight Bearing: Non weight bearing LUE Weight Bearing: Non weight bearing (able to bear weight thru elbow) RLE Weight Bearing: Weight bearing as tolerated (with transfers only, non-ambulatory) LLE Weight Bearing: Touchdown weight bearing Pain: Pain Assessment Pain Scale: 0-10 Pain Score: 0-No pain Pain Type: Acute pain;Surgical pain Pain Location: Leg Pain Orientation: Left Pain Descriptors / Indicators: Aching Pain Frequency: Intermittent Pain Onset: On-going Patients Stated Pain Goal: 0 Pain Intervention(s): premedicated)     Therapy/Group: Individual Therapy  Rich Brave 02/26/2021, 11:05 AM

## 2021-02-27 DIAGNOSIS — S2242XD Multiple fractures of ribs, left side, subsequent encounter for fracture with routine healing: Secondary | ICD-10-CM

## 2021-02-27 LAB — CBC
HCT: 26.9 % — ABNORMAL LOW (ref 36.0–46.0)
Hemoglobin: 8.1 g/dL — ABNORMAL LOW (ref 12.0–15.0)
MCH: 21 pg — ABNORMAL LOW (ref 26.0–34.0)
MCHC: 30.1 g/dL (ref 30.0–36.0)
MCV: 69.7 fL — ABNORMAL LOW (ref 80.0–100.0)
Platelets: 228 10*3/uL (ref 150–400)
RBC: 3.86 MIL/uL — ABNORMAL LOW (ref 3.87–5.11)
RDW: 17.9 % — ABNORMAL HIGH (ref 11.5–15.5)
WBC: 4.7 10*3/uL (ref 4.0–10.5)
nRBC: 0 % (ref 0.0–0.2)

## 2021-02-27 MED ORDER — FLUCONAZOLE 150 MG PO TABS
150.0000 mg | ORAL_TABLET | Freq: Once | ORAL | Status: AC
  Administered 2021-02-27: 150 mg via ORAL
  Filled 2021-02-27: qty 1
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
## 2021-02-27 NOTE — Progress Notes (Signed)
Occupational Therapy Session Note  Patient Details  Name: Jennifer Logan MRN: [id number] Date of Birth: [birth year]  Today's Date: 02/27/2021 OT Individual Time: 9892-1194 OT Individual Time Calculation (min): 75 min    Short Term Goals: Week 1:  OT Short Term Goal 1 (Week 1): Pt will be able to complete toilet transfer with mod A of 1. OT Short Term Goal 2 (Week 1): Pt will be able to complete toileting with max A of 1. OT Short Term Goal 3 (Week 1): Pt will be able to don bra and shirt with set up. OT Short Term Goal 4 (Week 1): Pt will be able to sit to EOB with CGA.  Skilled Therapeutic Interventions/Progress Updates:    Pt resting in bed upon arrival. OT intervention with focus on bathing/dressing EOB and SB tranfser to recliner. See Care Tool for detail on assist level. Pt requires assistance washing LUE 2/2 limited grasp with Rt hand. Pt required assistance with fastening front closure bra but was able to don shirt without assistance. Pt threaded BLE into pants using reacher but required assistance pulling over hips with lateral leans. Pt requires assistance managing BLE when performing lateral leans. SB transfer bed>recliner with CGA for SB management. Pt reamined in recliner with all needs withn reach.  Therapy Documentation Precautions:  Precautions Precautions: Fall Required Braces or Orthoses: Other Brace Other Brace: R camboot Restrictions Weight Bearing Restrictions: Yes RUE Weight Bearing: Weight bearing as tolerated LUE Weight Bearing: Non weight bearing RLE Weight Bearing: Weight bearing as tolerated LLE Weight Bearing: Touchdown weight bearing   Pain: Pain Assessment Pain Scale: 0-10 Pain Score: 3  Pain Type: Acute pain;Surgical pain Pain Location: Leg Pain Orientation: Left Pain Descriptors / Indicators: Aching Pain Frequency: Intermittent Pain Onset: On-going Pain Intervention(s): premedicated    Therapy/Group: Individual Therapy  Rich Brave 02/27/2021, 12:25 PM

## 2021-02-27 NOTE — Progress Notes (Signed)
Physical Therapy Session Note  Patient Details  Name: Jennifer Logan MRN: [id number] Date of Birth: 1991-07-04  Today's Date: 02/27/2021 PT Individual Time: 9163-8466 PT Individual Time Calculation (min): 60 min   Short Term Goals: Week 1:  PT Short Term Goal 1 (Week 1): Pt will perform least restrictive transfer with assist x 1 PT Short Term Goal 2 (Week 1): Pt will perform bed mobility with min A PT Short Term Goal 3 (Week 1): Pt wil initiate car transfer  Skilled Therapeutic Interventions/Progress Updates:    Pt received seated in recliner in room, agreeable to PT session. No complaints of pain at rest, does have onset of L hip tightness with mobility that improves at rest. Slide board transfer recliner to/from w/c at min A level overall this date with assist needed to stabilize slide board. Dependent w/c transport to/from therapy gym. Attempted car transfer again this date. Pt able to perform slide board transfer w/c to passenger seat of car with min A, unable to bring LE into the car due to pain and limited mobility in LLE. Slide board transfer into L side of car behind driver's seat to simulate transfer into back seat of car, min A. Pt better able to bring LE into car via this method. Recommend patient transfer into drivers side back seat of car. Also recommending patient transfer into sedan for improved safety and ease of SB transfer. Pt returned to room at end of session. Slide board transfer back to recliner. Pt then reports urge to urinate. Slide board transfer recliner to/from [facility] with min A. Pt is dependent for clothing management, setup A for pericare with assist needed to reach buttocks for thoroughness. Slide board transfer back to recliner with min A. Pt left seated in recliner in room with needs in reach at end of session.  Therapy Documentation Precautions:  Precautions Precautions: Fall Required Braces or Orthoses: Other Brace Other Brace: R camboot Restrictions Weight  Bearing Restrictions: Yes RUE Weight Bearing: Weight bearing as tolerated LUE Weight Bearing: Non weight bearing RLE Weight Bearing: Weight bearing as tolerated LLE Weight Bearing: Touchdown weight bearing    Therapy/Group: Individual Therapy   Peter Congo, PT, DPT, CSRS  02/27/2021, 12:40 PM

## 2021-02-27 NOTE — Progress Notes (Signed)
Occupational Therapy Session Note  Patient Details  Name: Jennifer Logan MRN: [id number] Date of Birth: [birth year]  Today's Date: 02/27/2021 OT Individual Time: 1300-1353 OT Individual Time Calculation (min): 53 min    Short Term Goals: Week 1:  OT Short Term Goal 1 (Week 1): Pt will be able to complete toilet transfer with mod A of 1. OT Short Term Goal 2 (Week 1): Pt will be able to complete toileting with max A of 1. OT Short Term Goal 3 (Week 1): Pt will be able to don bra and shirt with set up. OT Short Term Goal 4 (Week 1): Pt will be able to sit to EOB with CGA.  Skilled Therapeutic Interventions/Progress Updates:    OT intervention with focus on SB transfers, DME requirements, and discharge planning. SB transfer recliner>w/c (incline) with CGA after SB placement. SB transfer w/c>recliner (decline) with supervision after board placement. Pt performed transfers X 2. Pt's Aunt to provided measurements and take picture of bathroom where pt is discharging. Discussed possibility that pt will not be able to access bathroom at home. Discussed possibility that pt will complete bathing/dressing at bed level/seated EOB. Pt verbalizes understanding and realizes that this is a temporary condition. Pt pleased with progress with transfers and bathing/dressing at EOB. Pt remained in recliner with all needs within reach and seat alarm activated.   Therapy Documentation Precautions:  Precautions Precautions: Fall Required Braces or Orthoses: Other Brace Other Brace: R camboot Restrictions Weight Bearing Restrictions: Yes RUE Weight Bearing: Weight bearing as tolerated LUE Weight Bearing: Non weight bearing RLE Weight Bearing: Weight bearing as tolerated LLE Weight Bearing: Touchdown weight bearing   Pain:  "I'm ok"   Therapy/Group: Individual Therapy  Rich Brave 02/27/2021, 1:57 PM

## 2021-02-28 ENCOUNTER — Inpatient Hospital Stay (HOSPITAL_COMMUNITY): Payer: Self-pay

## 2021-02-28 DIAGNOSIS — S62102D Fracture of unspecified carpal bone, left wrist, subsequent encounter for fracture with routine healing: Secondary | ICD-10-CM

## 2021-02-28 LAB — BASIC METABOLIC PANEL
Anion gap: 6 (ref 5–15)
BUN: 9 mg/dL (ref 6–20)
CO2: 24 mmol/L (ref 22–32)
Calcium: 8.9 mg/dL (ref 8.9–10.3)
Chloride: 105 mmol/L (ref 98–111)
Creatinine, Ser: 0.7 mg/dL (ref 0.44–1.00)
GFR, Estimated: >60 mL/min (ref >60)
Glucose, Bld: 101 mg/dL — ABNORMAL HIGH (ref 70–99)
Potassium: 3.9 mmol/L (ref 3.5–5.1)
Sodium: 135 mmol/L (ref 135–145)

## 2021-02-28 NOTE — Progress Notes (Signed)
Patient ID: Jennifer Logan, female   DOB: Aug 05, 1991, 30 y.o.   MRN: [id number]  SW met with pt in room to provide updates from team conference, and d/c date 7/21. Pt reports that she is discharging to her aunt Jennifer Logan home. Address: [address], [place], [place] [address]. States she lives in a first floor apartment with no steps to enter; tub shower. States she will have intermittent support since her aunt works during the day. States various family members have said they will assist, but she states she does not rely on them. Pt to speak with aunt about family edu. SW discussed with pt to begin asking around about potential DME that may be needed such as w/c, RW, TTB, and 3in1 BSC. SW explained charity DME process if unable to obtain items. SW informed pt on [employer] medication assistance program that she will be enrolled too. SW will continue to follow and assess for d/c needs.   Loralee Pacas, MSW, [place] Office: [phone] Cell: [phone] Fax: [phone]
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
## 2021-02-28 NOTE — Progress Notes (Signed)
Occupational Therapy Session Note  Patient Details  Name: Jennifer Logan MRN: [id number] Date of Birth: 06-Nov-1990  Today's Date: 02/28/2021 OT Individual Time: 1400-1425 OT Individual Time Calculation (min): 25 min    Short Term Goals: Week 2:  OT Short Term Goal 1 (Week 2): Pt will be able to don bra and shirt with set up. OT Short Term Goal 2 (Week 2): Pt will be able to sit to EOB with CGA. OT Short Term Goal 3 (Week 2): Pt will don pants EOB with mod A using lateral leans  Skilled Therapeutic Interventions/Progress Updates:    OT intervention with focus on RUE strengthening with 2# barbell and BUE stretching (shoulder and scapula). Pt performed scapula protraction/retraction and shoulder rotation stretches. Pt encouraged to perform periodically throughout day. Straight arm raises 3x10, biceps curls 3x10, and shoulder abduction 3x10 with overhead raises. No pain reported. Pt remained in recliner with all needs within reach.  Therapy Documentation Precautions:  Precautions Precautions: Fall Required Braces or Orthoses: Other Brace Other Brace: R camboot Restrictions Weight Bearing Restrictions: Yes RUE Weight Bearing: Weight bearing as tolerated LUE Weight Bearing: Non weight bearing RLE Weight Bearing: Weight bearing as tolerated LLE Weight Bearing: Touchdown weight bearing Pain:  Pt denied pain meds when asked by RN; no report of pain    Therapy/Group: Individual Therapy  Rich Brave 02/28/2021, 2:41 PM

## 2021-02-28 NOTE — Progress Notes (Signed)
Occupational Therapy Weekly Progress Note  Patient Details  Name: Jennifer Logan MRN: [id number] Date of Birth: 07-22-1991  Beginning of progress report period: February 21, 2021 End of progress report period: February 28, 2021  Patient has met 2 of 4 short term goals.  Pt is making steady progress with BADLs and functional transfers. Pt currently requires mod A for supine>sit EOB but maintains sitting balance EOB with supervision. Bathing at EOB with mod A. UB dressing with min A for fastening/donning bra but is able to don pullover shirt with supervision. Pt is able to thread BLE into pants but requires max A to pull over hips. DABSC SB tranfsers with min A. Functional SB tranfsers with min A/CGA on level surface.  Patient continues to demonstrate the following deficits: muscle weakness and weight bearing restictions and decreased standing balance, decreased balance strategies, and decr activity tolerance  and therefore will continue to benefit from skilled OT intervention to enhance overall performance with BADL and Reduce care partner burden.  Patient not progressing toward long term goals.  See goal revision..  Continue plan of care. D/c goals that related to standing at this time and toileting goal downgraded to mod A.  OT Short Term Goals Week 1:  OT Short Term Goal 1 (Week 1): Pt will be able to complete toilet transfer with mod A of 1. OT Short Term Goal 1 - Progress (Week 1): Met OT Short Term Goal 2 (Week 1): Pt will be able to complete toileting with max A of 1. OT Short Term Goal 2 - Progress (Week 1): Met OT Short Term Goal 3 (Week 1): Pt will be able to don bra and shirt with set up. OT Short Term Goal 3 - Progress (Week 1): Progressing toward goal OT Short Term Goal 4 (Week 1): Pt will be able to sit to EOB with CGA. OT Short Term Goal 4 - Progress (Week 1): Progressing toward goal Week 2:  OT Short Term Goal 1 (Week 2): Pt will be able to don bra and shirt with set up. OT Short  Term Goal 2 (Week 2): Pt will be able to sit to EOB with CGA. OT Short Term Goal 3 (Week 2): Pt will don pants EOB with mod A using lateral leans  Pt      Leroy Libman 02/28/2021, 6:43 AM
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
## 2021-02-28 NOTE — Progress Notes (Signed)
Occupational Therapy Session Note  Patient Details  Name: Jennifer Logan MRN: [id number] Date of Birth: February 16, 1991  Today's Date: 02/28/2021 OT Individual Time: 1000-1100 OT Individual Time Calculation (min): 60 min    Short Term Goals: Week 2:  OT Short Term Goal 1 (Week 2): Pt will be able to don bra and shirt with set up. OT Short Term Goal 2 (Week 2): Pt will be able to sit to EOB with CGA. OT Short Term Goal 3 (Week 2): Pt will don pants EOB with mod A using lateral leans  Skilled Therapeutic Interventions/Progress Updates:    Pt resting in recliner upon arrival.  OT intervention with focus on discharge planning, BUE PROM, DABSC transfers, toileting, and safety awareness to increase independence with BADLs. DABSC SB transfers with CGA for board placement and stabilizing SB. Pt requires max A for clothing management. Recommended bathing/dressing seated on DABSC after discharge. Discussed taking a shower on Thursday. Education on BUE/shoulder AROM/PROM. Equipment in place for shower in room on Thursday. Pt remained in recliner with all needs within reach.   Therapy Documentation Precautions:  Precautions Precautions: Fall Required Braces or Orthoses: Other Brace Other Brace: R camboot Restrictions Weight Bearing Restrictions: Yes RUE Weight Bearing: Weight bearing as tolerated LUE Weight Bearing: Non weight bearing RLE Weight Bearing: Weight bearing as tolerated LLE Weight Bearing: Touchdown weight bearing   Pain: Pt denies pain this morning    Therapy/Group: Individual Therapy  Rich Brave 02/28/2021, 11:49 AM

## 2021-02-28 NOTE — Progress Notes (Signed)
Physical Therapy Weekly Progress Note  Patient Details  Name: Jennifer Logan MRN: [id number] Date of Birth: 03-01-1991  Beginning of progress report period: February 21, 2021 End of progress report period: February 28, 2021  Today's Date: 02/28/2021 PT Individual Time: 0800-0900; 1445-1530 PT Individual Time Calculation (min): 60 min and 45 min  Patient has met 3 of 3 short term goals.  Pt is making good progress towards therapy goals. Pt is currently min A for bed mobility for LLE management, min A for slide board transfers, and min A x 1-2 for car transfers. Pt remains dependent for w/c mobility due to Wbing restrictions. Pt is able to stand to B PFRW with max A x 2 but is unable to maintain TDWBing on LLE during standing and transfers and is not safe to continue with these types of transfers at this time. Pt is making great progress towards becoming more independent with slide board transfers.  Patient continues to demonstrate the following deficits muscle weakness and muscle joint tightness, decreased cardiorespiratoy endurance, and decreased standing balance, decreased postural control, and difficulty maintaining precautions and therefore will continue to benefit from skilled PT intervention to increase functional independence with mobility.  Patient progressing toward long term goals..  Continue plan of care.  PT Short Term Goals Week 1:  PT Short Term Goal 1 (Week 1): Pt will perform least restrictive transfer with assist x 1 PT Short Term Goal 1 - Progress (Week 1): Met PT Short Term Goal 2 (Week 1): Pt will perform bed mobility with min A PT Short Term Goal 2 - Progress (Week 1): Met PT Short Term Goal 3 (Week 1): Pt wil initiate car transfer PT Short Term Goal 3 - Progress (Week 1): Met Week 2:  PT Short Term Goal 1 (Week 2): =LTG due to ELOS  Skilled Therapeutic Interventions/Progress Updates:    Session 1: Pt received seated in bed, agreeable to PT session. Pt reports some soreness  in L pelvic region due to just having xrays done and positioning required for xray. Pt requesting to perform bathing/dressing and use the bathroom this AM. Supine to sitting EOB with min A with assist needed for LLE management, HOB elevated and use of bedrails. Slide board transfer bed to [facility] with CGA to min A, assist needed to stabilize slide board with transfer. Pt requires assist for clothing management, setup A for pericare with assist needed for cleaning buttocks for thoroughness. Pt able to perform LB and UB dressing while seated on BSC, requires assist for washing buttocks and RUE. Pt able to doff pants with use of reacher and thread pants onto BLE with reacher, requires assist to pull up over buttocks. Pt able to change her shirt with setup A. Slide board transfer to recliner with CGA. Pt left seated in recliner in room with needs in reach at end of session.  Session 2: Pt received seated in recliner in room, agreeable to PT session. No complaints of pain but does have discomfort in buttocks due to sitting up for extended period of time. Reviewed pressure relief techniques via lateral leans to offload buttocks in sitting position. Session focus on long-sitting and seated BLE strengthening therex and ROM. Seated LAQ/knee flexion x 10 reps; long-sitting hip abd, heel slides, SAQ, ankle pumps x 10 reps each. Pt requires AAROM for LLE management. Attempt to have pt utilize gait belt to assist with AAROM, fair ability to move limb with use of gait belt. Pt left seated in recliner in room  with needs in reach at end of session.  Therapy Documentation Precautions:  Precautions Precautions: Fall Required Braces or Orthoses: Other Brace Other Brace: R camboot Restrictions Weight Bearing Restrictions: Yes RUE Weight Bearing: Weight bearing as tolerated LUE Weight Bearing: Non weight bearing RLE Weight Bearing: Weight bearing as tolerated LLE Weight Bearing: Touchdown weight bearing   Therapy/Group:  Individual Therapy   Excell Seltzer, PT, DPT, CSRS  02/28/2021, 12:01 PM

## 2021-03-01 MED ORDER — HYDROCORTISONE (PERIANAL) 2.5 % EX CREA
TOPICAL_CREAM | Freq: Two times a day (BID) | CUTANEOUS | Status: DC
  Administered 2021-03-04: 1 via RECTAL
  Filled 2021-03-01: qty 28.35

## 2021-03-01 NOTE — Plan of Care (Signed)
  Problem: RH Balance Goal: LTG Patient will maintain dynamic standing with ADLs (OT) Description: LTG:  Patient will maintain dynamic standing balance with assist during activities of daily living (OT)  Outcome: Not Applicable Flowsheets (Taken 03/01/2021 1023) LTG: Pt will maintain dynamic standing balance during ADLs with: (d/c goal at this time JLS) -- Note: d/c goal at this time JLS   Problem: Sit to Stand Goal: LTG:  Patient will perform sit to stand in prep for activites of daily living with assistance level (OT) Description: LTG:  Patient will perform sit to stand in prep for activites of daily living with assistance level (OT) Outcome: Not Applicable Flowsheets (Taken 03/01/2021 1023) LTG: PT will perform sit to stand in prep for activites of daily living with assistance level: (d/c goal at this time JLS) -- Note: d/c goal at this time JLS   Problem: RH Toileting Goal: LTG Patient will perform toileting task (3/3 steps) with assistance level (OT) Description: LTG: Patient will perform toileting task (3/3 steps) with assistance level (OT)  Flowsheets (Taken 03/01/2021 1023) LTG: Pt will perform toileting task (3/3 steps) with assistance level: (downgraded JLS) Moderate Assistance - Patient 50 - 74% Note: downgraded JLS
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
## 2021-03-01 NOTE — Progress Notes (Signed)
Orthopedic Tech Progress Note Patient Details:  Jennifer Logan [birth year] [id number]  PER PA I removed splint to apply a removable Velcro one. Ortho Devices Type of Ortho Device: Velcro wrist splint Ortho Device/Splint Location: LUE Ortho Device/Splint Interventions: Ordered, Application, Adjustment   Post Interventions Patient Tolerated: Well Instructions Provided: Poper ambulation with device, Care of device  Donald Pore 03/01/2021, 9:56 AM

## 2021-03-01 NOTE — Progress Notes (Signed)
Occupational Therapy Session Note  Patient Details  Name: Jennifer Logan MRN: [id number] Date of Birth: May 19, 1991  Today's Date: 03/01/2021 OT Individual Time: 9381-0175 OT Individual Time Calculation (min): 70 min    Short Term Goals: Week 2:  OT Short Term Goal 1 (Week 2): Pt will be able to don bra and shirt with set up. OT Short Term Goal 2 (Week 2): Pt will be able to sit to EOB with CGA. OT Short Term Goal 3 (Week 2): Pt will don pants EOB with mod A using lateral leans  Skilled Therapeutic Interventions/Progress Updates:    OT intervention with focus on discharge planning, BUE therex (HEP provided); SB transfers, and safety awareness. Pt with new removable splint for LUE. Requested clarification with PA Dan regarding RUE and LUE WB precautions. Orders clarified-NWB through Lt wrist but may WB thru elbow. Rt wrist splint to wear with weightbearing. Lt wrist splint may be removed for bathing. HEP provided (see below) and reviewed with return demonstration. SB transfer to recliner with CGA. Pt remained in relciner with all needs within reach.  HEP issued to pt Access Code: [email or web address] URL: [email or web address] Date: 03/01/2021 Prepared by: Lavone Neri  Exercises Seated Shoulder Shrug Circles AROM Backward - 1 x daily - 7 x weekly - 3 sets - 10 reps Seated Shoulder Shrug Circles AROM Forward - 1 x daily - 7 x weekly - 3 sets - 10 reps Seated Shoulder Scaption AROM - 1 x daily - 7 x weekly - 3 sets - 10 reps Seated Shoulder Flexion - 1 x daily - 7 x weekly - 3 sets - 10 reps Seated Shoulder Flexion Full Range - 1 x daily - 7 x weekly - 3 sets - 10 reps Seated Chest Press - 1 x daily - 7 x weekly - 3 sets - 10 reps Seated Shoulder Abduction - Thumbs Up - 1 x daily - 7 x weekly - 3 sets - 10 reps   Therapy Documentation Precautions:  Precautions Precautions: Fall Required Braces or Orthoses: Other Brace Other Brace: R camboot Restrictions Weight Bearing  Restrictions: Yes RUE Weight Bearing: Weight bearing as tolerated LUE Weight Bearing: Non weight bearing RLE Weight Bearing: Weight bearing as tolerated LLE Weight Bearing: Touchdown weight bearing   Pain: Pain Assessment Pain Scale: 0-10 Pain Score: 2  Faces Pain Scale: No hurt Pain Type: Acute pain    Therapy/Group: Individual Therapy  Rich Brave 03/01/2021, 11:33 AM

## 2021-03-01 NOTE — Progress Notes (Signed)
Physical Therapy Session Note  Patient Details  Name: Jennifer Logan MRN: [id number] Date of Birth: 05/30/1991  Today's Date: 03/01/2021 PT Individual Time: 0800-0900; 1300-1400 PT Individual Time Calculation (min): 60 min and 60 min  Short Term Goals: Week 2:  PT Short Term Goal 1 (Week 2): =LTG due to ELOS  Skilled Therapeutic Interventions/Progress Updates:    Session 1: Pt received seated in bed, agreeable to PT session. No complaints of pain. Pt is setup A for bathing anterior periarea. Rolling L/R with min A for LLE management for dependent posterior periarea bathing. Pt requires min A to pull pants up over hips in bed. Seated in bed to sitting EOB with min A needed for LLE management. Slide board transfer to w/c with CGA for balance and assist required to stabilize board. Pt is setup A for oral hygiene while seated in w/c at sink. Dependent transport via w/c to/from therapy gym. Slide board transfer x 6 reps to/from mat table with close Supervision to CGA, assist needed to stabilize board. Pt able to perform transfers to slightly uphill mat table. Pt left seated in w/c in room with needs in reach at end of session.  Session 2: Pt received seated in recliner in room, agreeable to PT session. Pt reports some soreness in LUE since change to brace earlier this date with ortho. No new orders received regarding LUE and RUE, PA aware and to clarify orders. Slide board transfer to w/c with min A. Dependent transport via w/c to/from therapy gym and outdoors due to [facility] restrictions. Slide board transfer to/from sedan height car with min A overall. Pt exhibits improved ability to not only perform slide board transfer w/c to/from car but also to scoot hips back on the seat enough in order to bring BLE into the car independently. Pt exhibits significant improvement in ability to perform this transfer more independently this date. Pt unsure of vehicle she will d/c home in at this point but continue to  recommend sedan if available. Pt taken outdoors for improved patient mood. Dicussed patient's family dynamics and d/c plan, pt emotional and tearful, provided emotional support. Pt returned to room at end of session. Pt left seated in w/c in room with needs in reach, brother present.  Therapy Documentation Precautions:  Precautions Precautions: Fall Required Braces or Orthoses: Other Brace Other Brace: R camboot Restrictions Weight Bearing Restrictions: Yes RUE Weight Bearing: Weight bearing as tolerated LUE Weight Bearing: Non weight bearing RLE Weight Bearing: Weight bearing as tolerated LLE Weight Bearing: Touchdown weight bearing    Therapy/Group: Individual Therapy   Peter Congo, PT, DPT, CSRS  03/01/2021, 9:19 AM

## 2021-03-02 DIAGNOSIS — N6320 Unspecified lump in the left breast, unspecified quadrant: Secondary | ICD-10-CM

## 2021-03-02 DIAGNOSIS — S52612F Displaced fracture of left ulna styloid process, subsequent encounter for open fracture type IIIA, IIIB, or IIIC with routine healing: Secondary | ICD-10-CM

## 2021-03-02 NOTE — Progress Notes (Signed)
Occupational Therapy Session Note  Patient Details  Name: Jennifer Logan MRN: [id number] Date of Birth: [birth year]  Today's Date: 03/02/2021 OT Individual Time: 1300-1430 OT Individual Time Calculation (min): 90 min    Short Term Goals: Week 2:  OT Short Term Goal 1 (Week 2): Pt will be able to don bra and shirt with set up. OT Short Term Goal 2 (Week 2): Pt will be able to sit to EOB with CGA. OT Short Term Goal 3 (Week 2): Pt will don pants EOB with mod A using lateral leans  Skilled Therapeutic Interventions/Progress Updates:    OT intervention with focus on SB transfers, sit<>stand, stand pivot transfers, bathing at shower level, dressing with sit<>stand from EOB, safety awareness, and activity tolerance to increase independence with BADLs. SB transfer w/c>rolling shower chair with CGA. Bathing per Care Tool. Stand pivot from rolling shower chair to EOB with mod A+2. Dressing EOB per Care Tool. Sit<>stand and stand pivot transfer EOB to w/c with mod A+2. Pt remained in w/c with all needs within reach. All splints and Cam Boot donned for all transfers and removed for shower. Foam dressings on Lt hip replaced after shower.  Therapy Documentation Precautions:  Precautions Precautions: Fall Required Braces or Orthoses: Other Brace Other Brace: R camboot Restrictions Weight Bearing Restrictions: Yes RUE Weight Bearing: Weight bearing as tolerated LUE Weight Bearing: Non weight bearing RLE Weight Bearing: Weight bearing as tolerated LLE Weight Bearing: Touchdown weight bearing   Pain:  PT denies pain this afternoon    Therapy/Group: Individual Therapy  Rich Brave 03/02/2021, 2:47 PM

## 2021-03-02 NOTE — Progress Notes (Signed)
Occupational Therapy Session Note  Patient Details  Name: Jennifer Logan MRN: [id number] Date of Birth: 03/10/1991  Today's Date: 03/02/2021 OT Individual Time: 1130-1200 OT Individual Time Calculation (min): 30 min    Short Term Goals: Week 1:  OT Short Term Goal 1 (Week 1): Pt will be able to complete toilet transfer with mod A of 1. OT Short Term Goal 1 - Progress (Week 1): Met OT Short Term Goal 2 (Week 1): Pt will be able to complete toileting with max A of 1. OT Short Term Goal 2 - Progress (Week 1): Met OT Short Term Goal 3 (Week 1): Pt will be able to don bra and shirt with set up. OT Short Term Goal 3 - Progress (Week 1): Progressing toward goal OT Short Term Goal 4 (Week 1): Pt will be able to sit to EOB with CGA. OT Short Term Goal 4 - Progress (Week 1): Progressing toward goal  Skilled Therapeutic Interventions/Progress Updates:    1:1. Pt received in bed reporting "pain was where it normally is." Pt declines specific interventions. Pt completes CGA-S level SB transfers to/from mat. Pt work on lateral leaning in B directions to yoga block and/or mat for core stability/weight shifting. Theraband placed around BLE and pt requires MOD A to fully pull theraband up past hips. All done in prep for LB dressing. Exited session with pt seated in bed, exit alarm on and call light in reach   Therapy Documentation Precautions:  Precautions Precautions: Fall Required Braces or Orthoses: Other Brace Other Brace: R camboot Restrictions Weight Bearing Restrictions: Yes RUE Weight Bearing: Weight bearing as tolerated LUE Weight Bearing: Non weight bearing RLE Weight Bearing: Weight bearing as tolerated LLE Weight Bearing: Touchdown weight bearing General:   Vital Signs:   Pain: Pain Assessment Pain Scale: 0-10 Pain Score: 1  Faces Pain Scale: No hurt Pain Type: Acute pain Pain Location: Leg Pain Orientation: Left Pain Descriptors / Indicators: Aching Pain Frequency:  Intermittent Pain Onset: On-going ADL: ADL Grooming: Setup Upper Body Bathing: Setup Where Assessed-Upper Body Bathing: Other (Comment) (BSC) Lower Body Bathing: Maximal assistance Where Assessed-Lower Body Bathing: Other (Comment) (BSC) Upper Body Dressing: Minimal assistance Lower Body Dressing: Dependent Toileting: Other (Comment), Maximal assistance (+2) Where Assessed-Toileting: Bedside Commode Toilet Transfer: Moderate assistance, Other (comment) (+2) Toilet Transfer Method: Stand pivot Toilet Transfer Equipment: Art gallery manager    Praxis   Exercises:   Other Treatments:     Therapy/Group: Individual Therapy  Tonny Branch 03/02/2021, 12:28 PM

## 2021-03-02 NOTE — Progress Notes (Signed)
Occupational Therapy Session Note  Patient Details  Name: Jennifer Logan MRN: [id number] Date of Birth: 1991-06-16  Today's Date: 03/02/2021 OT Individual Time: 1130-1155 OT Individual Time Calculation (min): 25 min    Short Term Goals: Week 2:  OT Short Term Goal 1 (Week 2): Pt will be able to don bra and shirt with set up. OT Short Term Goal 2 (Week 2): Pt will be able to sit to EOB with CGA. OT Short Term Goal 3 (Week 2): Pt will don pants EOB with mod A using lateral leans  Skilled Therapeutic Interventions/Progress Updates:    OT intervention with focus on education in preparation for bathing at shower level during later session. Discussed with pt that all transfers will need be with all splints in place which will be removed for shower. Practiced SB transfers and sit<>stand in preparation for stand pivot transfers. Pt verbalized understanding. Pt remained in w/c with all needs within reach.   Therapy Documentation Precautions:  Precautions Precautions: Fall Required Braces or Orthoses: Other Brace Other Brace: R camboot Restrictions Weight Bearing Restrictions: Yes RUE Weight Bearing: Weight bearing as tolerated LUE Weight Bearing: Non weight bearing RLE Weight Bearing: Weight bearing as tolerated LLE Weight Bearing: Touchdown weight bearing   Pain: Pain Assessment Pain Scale: 0-10 Pain Score: 1  Faces Pain Scale: No hurt Pain Type: Acute pain Pain Location: Leg Pain Orientation: Left Pain Descriptors / Indicators: Aching Pain Frequency: Intermittent Pain Onset: On-going   Therapy/Group: Individual Therapy  Rich Brave 03/02/2021, 12:01 PM

## 2021-03-02 NOTE — Progress Notes (Signed)
Physical Therapy Session Note  Patient Details  Name: Jennifer Logan MRN: [id number] Date of Birth: Apr 11, 1991  Today's Date: 03/02/2021 PT Individual Time: 0900-1000 PT Individual Time Calculation (min): 60 min   Short Term Goals: Week 2:  PT Short Term Goal 1 (Week 2): =LTG due to ELOS  Skilled Therapeutic Interventions/Progress Updates:     Patient in bed upon PT arrival. Patient alert and agreeable to PT session. Patient reported 2-3/10 R and L lower extremity pain during session, RN made aware. PT provided repositioning, rest breaks, and distraction as pain interventions throughout session.   Patient reported increased L knee edema and pain with prolonged sitting and palpation on the medial side just below the joint line. Joint motion and ligament testing negative on assessment. AROM and muscle testing limited by L hip pain. Plan for OT to apply Kinesiotape for edema control following shower this afternoon, OT made aware.  Therapeutic Activity: Bed Mobility: Patient performed supine to/from sit with min A-CGA for L lower extremity management in a flat bed without use of bed rails. Patient able to teach back cues for technique from previous sessions. Transfers: Patient performed a slide board transfer bed<>BSC and bed>w/c with CGA-close supervision and total A for board placement on the L and mod A for placement on the R. Provided cues for hand/elbow placement, board placement, and head-hips relationship for proper technique and decreased assist with transfers. Patient was continent of bladder on BSC. Performed peri-care with set-up assist and lower body clothing management performing lateral leans with max A.  Wheelchair Mobility:  Patient propelled wheelchair in the room with R upper and lower extremity with R CAM boot donned with supervision for cues for steering technique, avoidance of obstacles, and hemi-technique. Provided demonstration and cues for use of breaks using R hand x2.    Therapeutic Exercise: Patient performed the following exercises 1 set 10 reps with verbal and tactile cues for proper technique. -B hip knee flexion with PROM-gentle AROM -R ankle DF/PF/inversion/eversion with gentile PROM -B hip IR/ER/ABD/ADD PROM-AROM  Patient in w/c in the room at end of session with breaks locked, chair alarm set, and all needs within reach.   Therapy Documentation Precautions:  Precautions Precautions: Fall Required Braces or Orthoses: Other Brace Other Brace: R camboot Restrictions Weight Bearing Restrictions: Yes RUE Weight Bearing: Weight bearing as tolerated LUE Weight Bearing: Non weight bearing RLE Weight Bearing: Weight bearing as tolerated LLE Weight Bearing: Touchdown weight bearing   Therapy/Group: Individual Therapy  Yama Nielson L Markan Cazarez PT, DPT  03/02/2021, 4:08 PM

## 2021-03-02 NOTE — Progress Notes (Signed)
Patient ID: Jennifer Logan, female   DOB: 03/10/1991, 30 y.o.   MRN: [id number]  SW returned phone call to pt, and pt reported her aunt said she is not able to stay at her home now. She is going to work on two other options. Pt is non WB status for 6-8 weeks.   Cecile Sheerer, MSW, LCSWA Office: [phone] Cell: [phone] Fax: [phone]
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
## 2021-03-02 NOTE — Progress Notes (Signed)
Pt c/o pain and new lump on left breast. Nurse assessed and felt golf ball sized mass on left breast. PA notified

## 2021-03-03 ENCOUNTER — Inpatient Hospital Stay (HOSPITAL_COMMUNITY): Payer: Self-pay

## 2021-03-03 NOTE — Progress Notes (Addendum)
Physical Therapy Note  Patient Details  Name: Jennifer Logan MRN: [id number] Date of Birth: 1990/12/25 Today's Date: 03/03/2021    Patient off unit for imaging. Patient missed 45 min of skilled PT due to being off unit, RN made aware. Will attempt to make-up missed time as able.    Emelee Rodocker L Tirza Senteno PT, DPT  03/03/2021, 10:38 AM

## 2021-03-03 NOTE — Progress Notes (Signed)
Occupational Therapy Session Note  Patient Details  Name: Jennifer Logan MRN: [id number] Date of Birth: Jan 03, 1991  Today's Date: 03/03/2021 OT Individual Time: 1303-1406 OT Individual Time Calculation (min): 63 min    Short Term Goals: Week 2:  OT Short Term Goal 1 (Week 2): Pt will be able to don bra and shirt with set up. OT Short Term Goal 2 (Week 2): Pt will be able to sit to EOB with CGA. OT Short Term Goal 3 (Week 2): Pt will don pants EOB with mod A using lateral leans  Skilled Therapeutic Interventions/Progress Updates:  Pt received supine in bed agreeable to OT intervention. Pt currently requires MINA for bed mobility with pt needing most assist to maneuver LLE to EOB, MIN A for SB transfers throughout session. Pt utilized sock aid to don sock on L foot but required MAX A to utilize. Pt transported to gym with total A, attempted to work on sit<>stands however pt reports too much pain in LLE to continue. Remainder of session to focus on seated exercises, pt reports tightness in back and shoulders. Pt completed trunk rotation R<>L, shoulder rolls forward<>backward, and modified crunches with therapy ball positioned behind pt x10 reps. Pt also completed lateral leans onto L elbow on therapy ball x10 reps. Pt transported back to room with total A with pt requesting to stay up in w/c. Pt left up in recliner with alarm belt activated, and all needs within reach.   Therapy Documentation Precautions:  Precautions Precautions: Fall Required Braces or Orthoses: Other Brace Other Brace: R camboot Restrictions Weight Bearing Restrictions: Yes RUE Weight Bearing: Weight bearing as tolerated LUE Weight Bearing: Non weight bearing RLE Weight Bearing: Weight bearing as tolerated LLE Weight Bearing: Touchdown weight bearing General:   Vital Signs:   Pain: Pt reports pain in LLE, utilized rest breaks, repositioning and deep breathing as pain mgmt strategy.    Therapy/Group: Individual  Therapy  Pollyann Glen [facility] 03/03/2021, 3:28 PM

## 2021-03-03 NOTE — Progress Notes (Signed)
Physical Therapy Session Note  Patient Details  Name: Jennifer Logan MRN: [id number] Date of Birth: [birth year]  Today's Date: 03/03/2021 PT Individual Time: 1445-1510 PT Individual Time Calculation (min): 25 min   Short Term Goals: Week 2:  PT Short Term Goal 1 (Week 2): =LTG due to ELOS  Skilled Therapeutic Interventions/Progress Updates:      Patient in w/c in the room upon PT arrival. Patient alert and agreeable to PT session. Patient reported 5-6/10 lower extremity pain during session, RN made aware. PT provided repositioning, rest breaks, and distraction as pain interventions throughout session. Patient internally distracted by concerns about lump found in breast. Provided therapeutic listening and coping strategies to manage stress about results of imaging.   Patient asked about weight bearing precaution expectations, as she was told that she would not be able to weight bear for 6-8 weeks. PT educated on bone healing process and average timeline expectations and general progression of weight bearing and mobility during healing. Encouraged patient to ask any further clarifying questions specific to her case to Ortho team. Patient appreciative of education and stated that she had a better understanding following.  Therapeutic Activity: Transfers: Patient attempted sit to stand with B PFRW. Made adjustments to platforms to improve patient's leverage on RW to stand, patient with increased challenge with standing this afternoon. Also removed R platform to attempt weight bearing through her wrist, however, patient did not tolerate this. Patient declined continued trials due to increased fatigue this afternoon. Patient performed a slide board transfer w/c<>drop arm BSC with supervision and total A for board placement. Provided min cues or teach back method for hand placement, board placement, and head-hips relationship for proper technique and decreased assist with transfers. Patient performed  lateral leans to perform lower body clothing management with mod-max A, using her R hand on the R side. She was continent of bladder and independent with peri-care during toileting.  Patient in w/c with her friend in the room prepared to go outside using grounds pass at end of session with breaks locked,and all needs within reach.   Therapy Documentation Precautions:  Precautions Precautions: Fall Required Braces or Orthoses: Other Brace Other Brace: R camboot Restrictions Weight Bearing Restrictions: Yes RUE Weight Bearing: Weight bearing as tolerated LUE Weight Bearing: Non weight bearing RLE Weight Bearing: Weight bearing as tolerated LLE Weight Bearing: Touchdown weight bearing    Therapy/Group: Individual Therapy  Tanner Yeley L Nijel Flink PT, DPT  03/03/2021, 4:09 PM

## 2021-03-03 NOTE — Progress Notes (Signed)
Occupational Therapy Session Note  Patient Details  Name: Jennifer Logan MRN: [id number] Date of Birth: Apr 07, 1991  Today's Date: 03/03/2021 OT Individual Time: 2778-2423 OT Individual Time Calculation (min): 80 min    Short Term Goals: Week 2:  OT Short Term Goal 1 (Week 2): Pt will be able to don bra and shirt with set up. OT Short Term Goal 2 (Week 2): Pt will be able to sit to EOB with CGA. OT Short Term Goal 3 (Week 2): Pt will don pants EOB with mod A using lateral leans  Skilled Therapeutic Interventions/Progress Updates:  Pt received seated EOB upon OTA arrival reporting need to void bladder. MIN A for SB transfer from EOB > drop arm 3n1. Pt required MAX A to manage clothing during pericare. Pt completed grooming tasks at sink with set- up assist from w/c level. Pt transported to day room with total A. Pt completed SB transfer to mat table from w/c with MINA mostly to position/ stabilize SB. Worked on lateral leans to R<>L elbow to simulate pericare, pt reports too much pain when leaning onto R and L elbow, discussed alternative methods for completing pericare from 3n1. Pt reports her sister lives close to the house she will DC to and reports her sister will likely assist most with pericare. Pt is able to stand from EOM with MOD A but also reports too much pain in standing to be able to complete simulated pericare or clothing mgmt. Pt transported back to room with total A where transport arrived to take pt to ultrasound, pt required MOD A for bed mobility to return to supine, pt transitioned to RN to assist pt with transport to ultrasound.   Therapy Documentation Precautions:  Precautions Precautions: Fall Required Braces or Orthoses: Other Brace Other Brace: R camboot Restrictions Weight Bearing Restrictions: Yes RUE Weight Bearing: Weight bearing as tolerated LUE Weight Bearing: Non weight bearing RLE Weight Bearing: Weight bearing as tolerated LLE Weight Bearing: Touchdown  weight bearing General:   Vital Signs:   Pain: Pt reports pain in L side of body ( L hips, L side of chest and back on L side). RN alerted for pain meds during session, rest breaks, deep breathing and repositioning utilized as pain mgmt strategy.    Therapy/Group: Individual Therapy  Pollyann Glen [facility] 03/03/2021, 12:06 PM
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
## 2021-03-05 NOTE — Progress Notes (Signed)
Physical Therapy Session Note  Patient Details  Name: Jennifer Logan MRN: [id number] Date of Birth: 02/21/1991  Today's Date: 03/06/2021 PT Individual Time: 0900-1000; 1300-1405 PT Individual Time Calculation (min): 60 min and 65 min  Short Term Goals: Week 2:  PT Short Term Goal 1 (Week 2): =LTG due to ELOS  Skilled Therapeutic Interventions/Progress Updates:    Session 1: Pt received seated in recliner in room, agreeable to PT session. Pt reports pain in L hip/pelvis and L knee with mobility as well as pain in R ankle with mobility. Pt able to receive pain medication at beginning of therapy session. Utilized ice packs to L hip and knee for pain management at end of session. Sit to stand with mod A to L PFRW. Stand pivot transfer to w/c with L PFRW and min A. Dependent transport via w/c to/from therapy gym due to Wbing restrictions. Slide board transfer into passenger side of car with min A needed for LLE management in/out of car. Recommending patient utilize slide board if transferring to level surface car that is similar height to w/c (such as a sedan) vs utilizing PFRW for a more elevated height car. Pt exhibits improved ability to perform transfers into passenger side of car this date. Slide board transfer w/c to/from bed with Supervision assist with assist needed to place board and stabilize during transfer. Sit to/from supine on flat bed with min A needed for LLE management. Pt left seated in recliner in room with needs in reach at end of session. Provided handout of HEP reviewed during previous session:  Access Code: [email or web address] URL: [email or web address] Date: 03/05/2021 Prepared by: Peter Congo  Exercises Seated March - 1-2 x daily - 7 x weekly - 3 sets - 10 reps Seated Long Arc Quad - 1-2 x daily - 7 x weekly - 3 sets - 10 reps Seated Knee Flexion AAROM - 1-2 x daily - 7 x weekly - 3 sets - 10 reps Seated Ankle Pumps - 1-2 x daily - 7 x weekly - 3 sets - 10  reps Seated Toe Curl - 1-2 x daily - 7 x weekly - 3 sets - 10 reps Supine Hip Abduction - 1-2 x daily - 7 x weekly - 3 sets - 10 reps Supine Heel Slide with Strap - 1-2 x daily - 7 x weekly - 3 sets - 10 reps  Session 2: Pt received seated in recliner in room, agreeable to PT session. No complaints of pain at rest, has onset of L hip pain with mobility. Provided ice pack to L hip and knee at end of session for pain and edema management. Pt reports urge to urinate. Slide board transfer recliner to/from drop arm BSC with Supervision assist. Pt able to doff pants and perform pericare with setup A, requires assist to pull pants back up over hips fully. Slide board transfer to/from bed with Supervision assist. Introduced leg lifter this session. With use of leg lifter and L elbow flexion pt able to manage LLE in/out of bed more independently at Supervision level with cues for use of leg lifter. Pt left seated in recliner in room with needs in reach at end of session.  Therapy Documentation Precautions:  Precautions Precautions: Fall Required Braces or Orthoses: Other Brace Other Brace: R camboot Restrictions Weight Bearing Restrictions: Yes RUE Weight Bearing: Weight bearing as tolerated LUE Weight Bearing: Non weight bearing RLE Weight Bearing: Weight bearing as tolerated LLE Weight Bearing: Touchdown weight bearing    Therapy/Group: Individual Therapy  Peter Congo, PT, DPT, CSRS  03/05/2021, 5:10 PM

## 2021-03-05 NOTE — Progress Notes (Signed)
Physical Therapy Session Note  Patient Details  Name: Jennifer Logan MRN: [id number] Date of Birth: 1991/05/28  Today's Date: 03/05/2021 PT Individual Time: 1000-1100; 1445-1540 PT Individual Time Calculation (min): 60 min and 55 min  Short Term Goals: Week 2:  PT Short Term Goal 1 (Week 2): =LTG due to ELOS  Skilled Therapeutic Interventions/Progress Updates:    Session 1: Pt received seated in recliner in room, very emotional and tearful with trouble breathing due being so emotional. Provided emotional support and guided patient through diaphragmatic breathing with improvement in symptoms noted. Pt able to report that she is very upset with being relocated to room on [address] and with how nursing staff has been transferring her and not listening to her regarding her WBing precautions and setting up her PFRW. Patient's nurse notified of patient's emotional state as well as concerns regarding how she is being transferred in/out of bed. Made sure patient's safety plan updated to reflect her current WBing status and transfer method. Patient able to be calmed down and agreeable to therapy session. Pt reports 9/10 pain in L pelvic/hip region from transfers, nursing able to provide pain medication during session. Pt is min to mod A for UB bathing, setup A for UB dressing. Sit to stand with mod A to PFRW this session. Pt is dependent for perihygiene in standing. Pt is max A to don pants and pull up over hips. Pt is setup A for oral hygiene while seated in recliner at sink. Pt left seated in recliner in room with needs in reach, ice pack to L hip and L knee for pain management at end of session.  Session 2: Pt received seated in recliner in room, agreeable to PT session. No complaints of pain. Pt requesting to use the bathroom, requests to transfer with PFRW rather than slide board this session. Sit to stand with mod A to RW from recliner. Stand pivot transfer to [facility] with RW and min A needed for balance  and for RW management. Pt able to continently void urine while seated on BSC, able to perform pericare with setup A. Pt requires assist for pulling pants up/down. Stand pivot transfer back to recliner with PFRW and min A. Seated BLE strengthening therex: marches, LAQ, heel/toe raises, toe curls. Pt exhibits improved ability to perform LLE ROM more independently, does require AAROM for marches. Pt unable to curl up towel with use of R toes but is able to flex/extend toes. Pt left seated in recliner in room with needs in reach, ice pack to L hip and L knee for pain management.  Therapy Documentation Precautions:  Precautions Precautions: Fall Required Braces or Orthoses: Other Brace Other Brace: R camboot Restrictions Weight Bearing Restrictions: Yes RUE Weight Bearing: Weight bearing as tolerated LUE Weight Bearing: Non weight bearing RLE Weight Bearing: Weight bearing as tolerated LLE Weight Bearing: Touchdown weight bearing    Therapy/Group: Individual Therapy   Peter Congo, PT, DPT, CSRS  03/05/2021, 12:14 PM

## 2021-03-06 LAB — CBC WITH DIFFERENTIAL/PLATELET
Abs Immature Granulocytes: 0.01 10*3/uL (ref 0.00–0.07)
Basophils Absolute: 0 10*3/uL (ref 0.0–0.1)
Basophils Relative: 1 %
Eosinophils Absolute: 0.2 10*3/uL (ref 0.0–0.5)
Eosinophils Relative: 5 %
HCT: 26.3 % — ABNORMAL LOW (ref 36.0–46.0)
Hemoglobin: 7.9 g/dL — ABNORMAL LOW (ref 12.0–15.0)
Immature Granulocytes: 0 %
Lymphocytes Relative: 40 %
Lymphs Abs: 1.6 10*3/uL (ref 0.7–4.0)
MCH: 20.8 pg — ABNORMAL LOW (ref 26.0–34.0)
MCHC: 30 g/dL (ref 30.0–36.0)
MCV: 69.2 fL — ABNORMAL LOW (ref 80.0–100.0)
Monocytes Absolute: 0.4 10*3/uL (ref 0.1–1.0)
Monocytes Relative: 10 %
Neutro Abs: 1.8 10*3/uL (ref 1.7–7.7)
Neutrophils Relative %: 44 %
Platelets: UNDETERMINED 10*3/uL (ref 150–400)
RBC: 3.8 MIL/uL — ABNORMAL LOW (ref 3.87–5.11)
RDW: 17.8 % — ABNORMAL HIGH (ref 11.5–15.5)
WBC: 4 10*3/uL (ref 4.0–10.5)
nRBC: 0 % (ref 0.0–0.2)

## 2021-03-06 LAB — BASIC METABOLIC PANEL
Anion gap: 7 (ref 5–15)
BUN: 10 mg/dL (ref 6–20)
CO2: 26 mmol/L (ref 22–32)
Calcium: 9.4 mg/dL (ref 8.9–10.3)
Chloride: 106 mmol/L (ref 98–111)
Creatinine, Ser: 0.65 mg/dL (ref 0.44–1.00)
GFR, Estimated: >60 mL/min (ref >60)
Glucose, Bld: 96 mg/dL (ref 70–99)
Potassium: 3.9 mmol/L (ref 3.5–5.1)
Sodium: 139 mmol/L (ref 135–145)

## 2021-03-06 NOTE — Progress Notes (Addendum)
Patient ID: Jennifer Logan, female   DOB: 01/22/1991, 30 y.o.   MRN: [id number]  SW f/u with patient after reports from therapy that pt mother and sister will be here tomorrow for family edu. SW called pt to discuss. States she is unsure on what time they will all be here. Pt states she will now d/c to a friend of her mother's. Pt d/c address: [address], [place], [place] [address].  *SW provided pt with therapy times scheduled for tomorrow for her to give to family.   Cecile Sheerer, MSW, LCSWA Office: [phone] Cell: [phone] Fax: [phone]

## 2021-03-06 NOTE — Progress Notes (Signed)
Occupational Therapy Session Note  Patient Details  Name: Jennifer Logan MRN: [id number] Date of Birth: 27-Jan-1991  Today's Date: 03/06/2021 OT Individual Time: 0700-0810 OT Individual Time Calculation (min): 70 min    Short Term Goals: Week 2:  OT Short Term Goal 1 (Week 2): Pt will be able to don bra and shirt with set up. OT Short Term Goal 2 (Week 2): Pt will be able to sit to EOB with CGA. OT Short Term Goal 3 (Week 2): Pt will don pants EOB with mod A using lateral leans  Skilled Therapeutic Interventions/Progress Updates:    Pt resting in recliner upon arrival. OT intervention with focus on bathing/dressing with sit<>stand from recliner. See Care Tool for detail assist levels. Sit<>stand from recliner with mod A but pt able to pull pants over hips with only min A while standing and maintaining WB precautions. Pt requires more then a reasonable amount of time to complete tasks. Standing balance with CGA using PFRW. Pt requires assistance doffing/donning CAM boot and Bil wrist splints. Pt remained in recliner with all needs within reach.  Therapy Documentation Precautions:  Precautions Precautions: Fall Required Braces or Orthoses: Other Brace Other Brace: R camboot Restrictions Weight Bearing Restrictions: Yes RUE Weight Bearing: Weight bearing as tolerated LUE Weight Bearing: Non weight bearing RLE Weight Bearing: Weight bearing as tolerated LLE Weight Bearing: Touchdown weight bearing   Pain: Pt denies pain this morning  Therapy/Group: Individual Therapy  Rich Brave 03/06/2021, 10:24 AM

## 2021-03-06 NOTE — Plan of Care (Signed)
  Problem: RH Bed Mobility Goal: LTG Patient will perform bed mobility with assist (PT) Description: LTG: Patient will perform bed mobility with assistance, with/without cues (PT). Flowsheets (Taken 03/06/2021 1046) LTG: Pt will perform bed mobility with assistance level of: (downgrade due to slow progress with bed mobility) Minimal Assistance - Patient > 75% Note: Downgrade due to slow progress with bed mobility   Problem: RH Bed to Chair Transfers Goal: LTG Patient will perform bed/chair transfers w/assist (PT) Description: LTG: Patient will perform bed to chair transfers with assistance (PT). Flowsheets (Taken 03/06/2021 1046) LTG: Pt will perform Bed to Chair Transfers with assistance level: (upgrade due to progress) Supervision/Verbal cueing Note: Upgrade due to progress

## 2021-03-07 NOTE — Progress Notes (Signed)
Occupational Therapy Session Note  Patient Details  Name: Jennifer Logan MRN: [id number] Date of Birth: 15-Jul-1991  Today's Date: 03/07/2021 OT Individual Time: 1130-1200 OT Individual Time Calculation (min): 30 min    Short Term Goals: Week 2:  OT Short Term Goal 1 (Week 2): Pt will be able to don bra and shirt with set up. OT Short Term Goal 2 (Week 2): Pt will be able to sit to EOB with CGA. OT Short Term Goal 3 (Week 2): Pt will don pants EOB with mod A using lateral leans  Skilled Therapeutic Interventions/Progress Updates:    Pt resting in recliner with mother and sister present. OT intervention with focus on family education. Explained pt's current assist levels for bathing/dressing, toileting, and functional transfers. Demonstrated sit<>stand with PFRW. Time constraints did not allow adequate time for return demonstrations. Reviewed all WB precautions and safety precautions. Pt remained in recliner with all needs within reach.   Therapy Documentation Precautions:  Precautions Precautions: Fall Required Braces or Orthoses: Other Brace Other Brace: R camboot Restrictions Weight Bearing Restrictions: Yes RUE Weight Bearing: Weight bearing as tolerated LUE Weight Bearing: Non weight bearing RLE Weight Bearing: Weight bearing as tolerated LLE Weight Bearing: Touchdown weight bearing   Pain: Pt denies pain  Therapy/Group: Individual Therapy  Rich Brave 03/07/2021, 12:07 PM

## 2021-03-07 NOTE — Progress Notes (Signed)
Occupational Therapy Session Note  Patient Details  Name: Jennifer Logan MRN: [id number] Date of Birth: 1990-11-19  Today's Date: 03/07/2021 OT Individual Time: 6659-9357 OT Individual Time Calculation (min): 70 min    Short Term Goals: Week 2:  OT Short Term Goal 1 (Week 2): Pt will be able to don bra and shirt with set up. OT Short Term Goal 2 (Week 2): Pt will be able to sit to EOB with CGA. OT Short Term Goal 3 (Week 2): Pt will don pants EOB with mod A using lateral leans  Skilled Therapeutic Interventions/Progress Updates:    OT intervention with focus on toilet transfers, toileting, bathing/dressing with sit<>stand, standing balance, safety awareness, and activity tolerance to increase independence with BADLs. SB transfer recliner>DABSC with supervision. Pt completed bathing/dressing seated on BSC with sit<>stand to pull pants over hips. Pt required assistance to bathe RUE and sit>stand from [facility]. Stand pivot transfer with min A for PFRW mgmt. See Care Tool for detail assist levels. Pt remained seated in recliner with all needs within reach.   Therapy Documentation Precautions:  Precautions Precautions: Fall Required Braces or Orthoses: Other Brace Other Brace: R camboot Restrictions Weight Bearing Restrictions: Yes RUE Weight Bearing: Weight bearing as tolerated LUE Weight Bearing: Non weight bearing RLE Weight Bearing: Weight bearing as tolerated LLE Weight Bearing: Touchdown weight bearing   Pain: Pt reports,"I'm ok"  Therapy/Group: Individual Therapy  Rich Brave 03/07/2021, 9:27 AM
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
## 2021-03-07 NOTE — Progress Notes (Signed)
Patient ID: Jennifer Logan, female   DOB: [birth year], 30 y.o.   MRN: [id number]  SW sent charity HHPT/OT referral to [facility] and waiting on f/u. *HH declined as they do not service this area. SW updated medical team requesting HEP for pt if it has not already been given.   SW met with pt in room to provide updates from team conference on gains made in rehab, and d/c date remains 7/22. Pt is unsure on who will pick her up tomorrow, but it is likely to be mother or sister.   Jennifer Logan, MSW, [place] Office: [phone] Cell: [phone] Fax: [phone]

## 2021-03-07 NOTE — Progress Notes (Signed)
5 sutures removed from right ankle and leg this morning. Patient pre-medicated for pain, tolerated procedure well. Vic Ripper

## 2021-03-07 NOTE — Progress Notes (Signed)
Physical Therapy Session Note  Patient Details  Name: Jennifer Logan MRN: [id number] Date of Birth: 05-27-1991  Today's Date: 03/07/2021 PT Individual Time: 1300-1410 PT Individual Time Calculation (min): 70 min   Short Term Goals: Week 2:  PT Short Term Goal 1 (Week 2): =LTG due to ELOS  Skilled Therapeutic Interventions/Progress Updates:    Pt received seated in recliner in room, agreeable to PT session. Pt's mom and sister present for hands-on family education session. Reviewed patient's current WBing precautions with family. Pt reports pain in LLE at rest, not rated. Utilized ice pack to L knee at end of session for pain management. Sit to stand with min to mod A to L PFRW throughout session. Stand pivot transfer with PFRW and min A for balance and RW management. Slide board transfers w/c to/from mat table with setup A and assist to stabilize slide board. Car transfer with setup A for slide board management, pt able to manage LLE in/out of car with use of leg lifter. Pt's mom and sister able to perform return demonstrations of assisting pt with sit to stand to PFRW, slide board transfers, and car transfers. Pt transferred back to recliner at end of session. Pt left seated in recliner with needs in reach, ice pack to L knee, family present.  Therapy Documentation Precautions:  Precautions Precautions: Fall Required Braces or Orthoses: Other Brace Other Brace: R camboot Restrictions Weight Bearing Restrictions: Yes RUE Weight Bearing: Weight bearing as tolerated LUE Weight Bearing: Non weight bearing RLE Weight Bearing: Weight bearing as tolerated LLE Weight Bearing: Touchdown weight bearing    Therapy/Group: Individual Therapy   Peter Congo, PT, DPT, CSRS  03/07/2021, 5:14 PM
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
## 2021-03-08 ENCOUNTER — Other Ambulatory Visit (HOSPITAL_COMMUNITY): Payer: Self-pay

## 2021-03-08 MED ORDER — ENOXAPARIN SODIUM 30 MG/0.3ML IJ SOSY
PREFILLED_SYRINGE | INTRAMUSCULAR | 0 refills | Status: DC
  Filled 2021-03-08: qty 8.4, 14d supply, fill #0

## 2021-03-08 MED ORDER — DOCUSATE SODIUM 100 MG PO CAPS: 100.0000 mg | ORAL_CAPSULE | Freq: Two times a day (BID) | ORAL | 0 refills | Status: AC

## 2021-03-08 MED ORDER — CITALOPRAM HYDROBROMIDE 20 MG PO TABS
20.0000 mg | ORAL_TABLET | Freq: Every day | ORAL | Status: DC
  Filled 2021-03-08: qty 1

## 2021-03-08 MED ORDER — METHOCARBAMOL 750 MG PO TABS
750.0000 mg | ORAL_TABLET | Freq: Three times a day (TID) | ORAL | 0 refills | Status: DC
  Filled 2021-03-08: qty 90, 30d supply, fill #0

## 2021-03-08 MED ORDER — LIDOCAINE 5 % EX PTCH
1.0000 | MEDICATED_PATCH | CUTANEOUS | 0 refills | Status: DC
  Filled 2021-03-08: qty 30, 30d supply, fill #0

## 2021-03-08 MED ORDER — OXYCODONE HCL 5 MG PO TABS
5.0000 mg | ORAL_TABLET | ORAL | 0 refills | Status: DC | PRN
  Filled 2021-03-08: qty 30, 3d supply, fill #0

## 2021-03-08 MED ORDER — VALACYCLOVIR HCL 500 MG PO TABS
500.0000 mg | ORAL_TABLET | Freq: Every day | ORAL | 0 refills | Status: DC
  Filled 2021-03-08: qty 20, 20d supply, fill #0

## 2021-03-08 MED ORDER — CITALOPRAM HYDROBROMIDE 20 MG PO TABS
20.0000 mg | ORAL_TABLET | Freq: Every day | ORAL | 0 refills | Status: DC
  Filled 2021-03-08: qty 30, 30d supply, fill #0

## 2021-03-08 MED ORDER — CHOLECALCIFEROL 50 MCG (2000 UT) PO TABS
2000.0000 [IU] | ORAL_TABLET | Freq: Every day | ORAL | 0 refills | Status: DC
  Filled 2021-03-08: qty 30, 30d supply, fill #0

## 2021-03-08 MED ORDER — ENOXAPARIN (LOVENOX) PATIENT EDUCATION KIT
PACK | Freq: Once | Status: AC
  Filled 2021-03-08: qty 1

## 2021-03-08 MED ORDER — POLYETHYLENE GLYCOL 3350 17 G PO PACK: 17.0000 g | PACK | Freq: Two times a day (BID) | ORAL | 0 refills | Status: DC

## 2021-03-08 MED ORDER — ACETAMINOPHEN 325 MG PO TABS: 325.0000 mg | ORAL_TABLET | ORAL | Status: DC | PRN

## 2021-03-08 NOTE — Progress Notes (Signed)
Occupational Therapy Session Note  Patient Details  Name: Jennifer Logan MRN: [id number] Date of Birth: 07-06-1991  Today's Date: 03/08/2021 OT Group Time: 1330-1430 OT Group Time Calculation (min): 60 min   Short Term Goals: Week 2:  OT Short Term Goal 1 (Week 2): Pt will be able to don bra and shirt with set up. OT Short Term Goal 2 (Week 2): Pt will be able to sit to EOB with CGA. OT Short Term Goal 3 (Week 2): Pt will don pants EOB with mod A using lateral leans  Skilled Therapeutic Interventions/Progress Updates:  Pt was seen for skilled group session with focus of group session on  social engagement, activity tolerance, dynamic reaching from w/c level, RUE coordination and education related to fall prevention and usage of reacher . Pt actively participated in therapeutic activity group where pt engaged in corn hole game from w/c level. Pt able to toss bags to target with RUE from w/c level with pt needing assist to manage w/c around tight spaces, pt able to use RUE to write score on score board and utilized reacher to retrieve bean bags from floor level. Pt able to practice with maneuver w/c parts needing only supervision and intermittent cues to remove legs rests as needed. Pt reported no pain during session. Pt was transported back to room by rehab tech.     Therapy Documentation Precautions:  Precautions Precautions: Fall Required Braces or Orthoses: Other Brace Other Brace: R camboot Restrictions Weight Bearing Restrictions: Yes RUE Weight Bearing: Weight bearing as tolerated LUE Weight Bearing: Non weight bearing RLE Weight Bearing: Weight bearing as tolerated LLE Weight Bearing: Touchdown weight bearing  Pain: Pt reports no pain during session.    Therapy/Group: Group Therapy  Barron Schmid 03/08/2021, 4:11 PM
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
## 2021-03-08 NOTE — Progress Notes (Addendum)
Patient ID: Jennifer Logan, female   DOB: 26-Mar-1991, 30 y.o.   MRN: [id number]  Pt set up for [facility] medication assistance program.   Pt scheduled for new patient appointment/hospital follow-up with [place] location [phone] for Tuesday, August 2 at 11am, must arrive by 10:45am. Clinic is sliding fee clinic, must bring in proof of income for herself or individuals who reside in the home, and/or notarized letter stating her current situation. SW will discuss with patient.   SW received updates from [place] in which they reported pt will only receive one platform attachment, and the other will be shipped to the home since they were out of stock. All other DME to be delivered.   [facility] reported pt refused DABSC as she has one already.   *SW met with pt in room to discuss above. Pt reports concerns related to discharge to home and possible transportation issues. SW informed will f/u tomorrow and see what she was able to arrange.   Loralee Pacas, MSW, [place] Office: [phone] Cell: [phone] Fax: [phone]

## 2021-03-08 NOTE — Progress Notes (Signed)
Occupational Therapy Session Note  Patient Details  Name: Jennifer Logan MRN: [id number] Date of Birth: 1991/03/04  Today's Date: 03/08/2021 OT Individual Time: 0700-0810 OT Individual Time Calculation (min): 70 min    Short Term Goals: Week 2:  OT Short Term Goal 1 (Week 2): Pt will be able to don bra and shirt with set up. OT Short Term Goal 2 (Week 2): Pt will be able to sit to EOB with CGA. OT Short Term Goal 3 (Week 2): Pt will don pants EOB with mod A using lateral leans  Skilled Therapeutic Interventions/Progress Updates:    Pt resting in bed upon arrival. Pt upset about discharging to [facility] because she knows her mother will not provide any assistance. Pt recognizes that she has no other option at this time and there are other friends and family members (sister) who will provide assistance as their schedule allows. Emotional support provided and pt's affect improved as session progressed.  OT intervention with focus on bed mobility, SB tranfsers, toileting, bathing/dressing with sit<>stand from [facility], stand pivot transfers, and safety awareness to prepare for discharge tomorrow. Pt uses leg lifter to move LLE off EOB and sit EOB in preparation for SB tranfser to DABSC. SB tranfser with supervision after SB placement. Pt completed toileting tasks and bathing/dressing tasks with sit<>stand from [facility]. Pt used reacher for [employer]. Pt requires assistance donning CAM boot and Rt wrist splint. Sit<>stand with min A. CGA for stand pivot tranfser to recliner. See Care Tool for detail assist levels. Reviewed HEP while seated in recliner. Pt pleased with progress and ready for discharge tomorrow. Pt remained in relciner with all needs within reach.   Therapy Documentation Precautions:  Precautions Precautions: Fall Required Braces or Orthoses: Other Brace Other Brace: R camboot Restrictions Weight Bearing Restrictions: Yes RUE Weight Bearing: Weight bearing as  tolerated LUE Weight Bearing: Non weight bearing RLE Weight Bearing: Weight bearing as tolerated LLE Weight Bearing: Touchdown weight bearing  Pain: Pt denies pain this morning   Therapy/Group: Individual Therapy  Rich Brave 03/08/2021, 8:15 AM
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
## 2021-03-09 NOTE — Progress Notes (Signed)
Patient ID: Jennifer Logan, female   DOB: 1990/09/10, 30 y.o.   MRN: [id number]  SW called pt this morning to follow-up about transportation. Pt reports that her mother rented a car and will pick her up. Likely to be here between 10:30am-12pm since she is travelling from [place], [place]. Pt has not questions/concerns about d/c needs going forward.   Cecile Sheerer, MSW, LCSWA Office: [phone] Cell: [phone] Fax: [phone]

## 2021-03-09 NOTE — Progress Notes (Signed)
INPATIENT REHABILITATION DISCHARGE NOTE   Discharge instructions by: Jesusita Oka PA  Verbalized understanding:yes  Skin care/Wound care:done; foam dressings  Pain:none  IV's:none  Tubes/Drains:none  Safety instructions:done per pt ; slide board  Patient belongings:done  Discharged KZ:LDJT  Discharged TSV:XBLTJ chair  Notes:

## 2021-03-09 NOTE — Progress Notes (Signed)
Inpatient Rehabilitation Care Coordinator Discharge Note  The overall goal for the admission was met for:   Discharge location: Yes. D/c to a friend of her mother's.   Length of Stay: Yes. 16 days.   Discharge activity level: Supervision at w/c level.  Home/community participation: Yes.Limited.   Services provided included: MD, RD, PT, OT, RN, CM, TR, Pharmacy, Neuropsych, and SW  Financial Services: Other: Uninsured  Choices offered to/list presented to:Yes  Follow-up services arranged: DME: [employer] for bilateral leg rests and platform attachment for rolling walker ; Unable to get patient home health due to charity [facility] does not service pt d/c location.  Comments (or additional information): Pt set up for PCP with [facility] location #[phone] on Tuesday, August 2 at 11am (sliding fee scale).  Patient/Family verbalized understanding of follow-up arrangements: Yes  Individual responsible for coordination of the follow-up plan: contact pt #[phone]   Confirmed correct DME delivered: Jennifer Logan 03/09/2021    Jennifer Logan 
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
## 2021-03-13 ENCOUNTER — Telehealth (HOSPITAL_COMMUNITY): Payer: Self-pay

## 2021-03-13 ENCOUNTER — Other Ambulatory Visit (HOSPITAL_COMMUNITY): Payer: Self-pay

## 2021-03-13 NOTE — Telephone Encounter (Signed)
Pt slowly improving. Pt made follow up appts for August 1st and is using Lovenox twice daily for 2 weeks. No bleeding/bruising noted.

## 2021-03-15 ENCOUNTER — Other Ambulatory Visit (HOSPITAL_COMMUNITY): Payer: Self-pay

## 2021-03-20 ENCOUNTER — Encounter: Payer: Self-pay | Attending: Registered Nurse | Admitting: Registered Nurse

## 2021-03-20 ENCOUNTER — Other Ambulatory Visit: Payer: Self-pay

## 2021-03-20 ENCOUNTER — Other Ambulatory Visit (HOSPITAL_COMMUNITY): Payer: Self-pay

## 2021-03-20 ENCOUNTER — Encounter: Payer: Self-pay | Admitting: Registered Nurse

## 2021-03-20 VITALS — BP 107/73 | HR 88 | Temp 99.0°F | Ht 66.0 in | Wt 203.0 lb

## 2021-03-20 DIAGNOSIS — S82871A Displaced pilon fracture of right tibia, initial encounter for closed fracture: Secondary | ICD-10-CM

## 2021-03-20 DIAGNOSIS — S2242XD Multiple fractures of ribs, left side, subsequent encounter for fracture with routine healing: Secondary | ICD-10-CM | POA: Insufficient documentation

## 2021-03-20 DIAGNOSIS — S52611D Displaced fracture of right ulna styloid process, subsequent encounter for closed fracture with routine healing: Secondary | ICD-10-CM | POA: Insufficient documentation

## 2021-03-20 DIAGNOSIS — R911 Solitary pulmonary nodule: Secondary | ICD-10-CM | POA: Insufficient documentation

## 2021-03-20 DIAGNOSIS — S52502D Unspecified fracture of the lower end of left radius, subsequent encounter for closed fracture with routine healing: Secondary | ICD-10-CM | POA: Insufficient documentation

## 2021-03-20 DIAGNOSIS — S32402D Unspecified fracture of left acetabulum, subsequent encounter for fracture with routine healing: Secondary | ICD-10-CM | POA: Insufficient documentation

## 2021-03-20 MED ORDER — OXYCODONE HCL 5 MG PO TABS
5.0000 mg | ORAL_TABLET | ORAL | 0 refills | Status: DC | PRN
  Filled 2021-03-20: qty 42, 7d supply, fill #0
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
## 2021-03-20 NOTE — Patient Instructions (Addendum)
Call to schedule appointment for Primary Care and Obtain the [facility] Card:   [facility] Address: [address] [place], [place] [address] Hours:  Open ? Closes 5:30 PM Phone: [phone]

## 2021-03-22 ENCOUNTER — Encounter: Payer: Self-pay | Admitting: Registered Nurse

## 2021-03-29 ENCOUNTER — Other Ambulatory Visit (HOSPITAL_COMMUNITY): Payer: Self-pay

## 2021-03-29 MED ORDER — CELECOXIB 200 MG PO CAPS
200.0000 mg | ORAL_CAPSULE | Freq: Two times a day (BID) | ORAL | 0 refills | Status: DC
  Filled 2021-03-29: qty 28, 14d supply, fill #0

## 2021-04-06 ENCOUNTER — Other Ambulatory Visit (HOSPITAL_COMMUNITY): Payer: Self-pay

## 2021-05-18 ENCOUNTER — Telehealth: Payer: Self-pay | Admitting: Physical Medicine and Rehabilitation
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
## 2021-05-19 NOTE — Telephone Encounter (Addendum)
Her surgeon said Dr Jennifer Logan should make the decision. Please place Jennifer Logan on Dr Jennifer Logan cancellation list if something comes available before her November 28 appt.

## 2021-05-29 ENCOUNTER — Encounter: Payer: Self-pay | Admitting: Physical Medicine and Rehabilitation

## 2021-07-17 ENCOUNTER — Encounter: Payer: Self-pay | Admitting: Physical Medicine and Rehabilitation

## 2021-10-29 ENCOUNTER — Other Ambulatory Visit: Payer: Self-pay

## 2021-10-29 ENCOUNTER — Encounter (HOSPITAL_COMMUNITY): Payer: Self-pay | Admitting: *Deleted

## 2021-10-29 ENCOUNTER — Ambulatory Visit (HOSPITAL_COMMUNITY)
Admission: EM | Admit: 2021-10-29 | Discharge: 2021-10-29 | Disposition: A | Discharge status: Home / Self Care | Payer: Medicaid Other | Attending: Student | Admitting: Student

## 2021-10-29 DIAGNOSIS — L0201 Cutaneous abscess of face: Secondary | ICD-10-CM

## 2021-10-29 MED ORDER — CEPHALEXIN 500 MG PO CAPS: 500.0000 mg | ORAL_CAPSULE | Freq: Four times a day (QID) | ORAL | 0 refills | Status: AC

## 2021-10-29 NOTE — Discharge Instructions (Addendum)
-  Start the antibiotic: Keflex, 4x daily x5 days. You can take this with food if you have a sensitive stomach. ?-Warm compresses twice daily  ?-Wash with gentle soap and water  ?-Follow-up if symptoms getting worse instead of better- pain , swelling ?
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
## 2021-10-29 NOTE — ED Triage Notes (Signed)
Pt reports waking up 3 days ago with abscess to left forehead "that came up overnight". States "every morning when I wake up" c/o swelling to face. Denies fevers. ?
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
## 2023-12-16 ENCOUNTER — Other Ambulatory Visit: Payer: Self-pay

## 2024-08-11 ENCOUNTER — Other Ambulatory Visit: Payer: Self-pay

## 2024-08-11 ENCOUNTER — Emergency Department (HOSPITAL_BASED_OUTPATIENT_CLINIC_OR_DEPARTMENT_OTHER)
Admission: EM | Admit: 2024-08-11 | Discharge: 2024-08-11 | Disposition: A | Discharge status: Home / Self Care | Payer: Worker's Compensation | Attending: Emergency Medicine | Admitting: Emergency Medicine

## 2024-08-11 ENCOUNTER — Encounter (HOSPITAL_BASED_OUTPATIENT_CLINIC_OR_DEPARTMENT_OTHER): Payer: Self-pay | Admitting: Emergency Medicine

## 2024-08-11 DIAGNOSIS — W228XXA Striking against or struck by other objects, initial encounter: Secondary | ICD-10-CM | POA: Diagnosis not present

## 2024-08-11 DIAGNOSIS — O9A212 Injury, poisoning and certain other consequences of external causes complicating pregnancy, second trimester: Secondary | ICD-10-CM | POA: Insufficient documentation

## 2024-08-11 DIAGNOSIS — S0990XA Unspecified injury of head, initial encounter: Secondary | ICD-10-CM | POA: Diagnosis not present

## 2024-08-11 DIAGNOSIS — Y990 Civilian activity done for income or pay: Secondary | ICD-10-CM | POA: Insufficient documentation

## 2024-08-11 MED ORDER — ACETAMINOPHEN 500 MG PO TABS
1000.0000 mg | ORAL_TABLET | Freq: Once | ORAL | Status: AC
  Administered 2024-08-11: 1000 mg via ORAL
  Filled 2024-08-11: qty 2

## 2024-08-11 NOTE — Discharge Instructions (Addendum)
 It was a pleasure taking care of you today.  Based on your history and physical exam I feel you are safe for discharge.  Today we decided not to do a CT scan while in the emergency department due to your pregnancy and also the mechanism of injury.  Your physical exam was reassuring.  Please continue to monitor your symptoms.  If you develop severe headache, vision changes, issues of balance or coordination, excessive nausea/vomiting, excessive drowsiness, syncope, seizures, or other concerning symptom please return to the emergency department or seek further medical care.  Please make your primary care provider as well as OB/GYN aware of your visit and findings today.  Due to your pregnancy you may take over-the-counter Tylenol  to help with headache.  Please do not exceed the max daily limit of 4000 mg/day or more than 1000 mg in a single dose.  If symptoms worsen recommend follow-up within 24 hours.  Recommend follow-up with primary care provider or OB/GYN within the next week for symptom recheck.
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
## 2024-08-11 NOTE — ED Notes (Signed)
 DC paperwork given and verbally understood.
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
## 2024-09-25 ENCOUNTER — Other Ambulatory Visit: Payer: Self-pay | Admitting: Obstetrics & Gynecology

## 2024-09-25 DIAGNOSIS — Z36 Encounter for antenatal screening for chromosomal anomalies: Secondary | ICD-10-CM

## 2024-11-09 ENCOUNTER — Ambulatory Visit: Payer: Self-pay
